# Patient Record
Sex: Female | Born: 1950 | Race: Black or African American | Hispanic: No | Marital: Married | Smoking: Former smoker
Health system: Southern US, Community
[De-identification: ages and names within clinical notes are randomized; demographics above are authoritative.]

## PROBLEM LIST (undated history)

## (undated) DIAGNOSIS — D241 Benign neoplasm of right breast: Secondary | ICD-10-CM

## (undated) DIAGNOSIS — T8859XA Other complications of anesthesia, initial encounter: Secondary | ICD-10-CM

## (undated) DIAGNOSIS — M8580 Other specified disorders of bone density and structure, unspecified site: Secondary | ICD-10-CM

## (undated) DIAGNOSIS — N879 Dysplasia of cervix uteri, unspecified: Secondary | ICD-10-CM

## (undated) DIAGNOSIS — T4145XA Adverse effect of unspecified anesthetic, initial encounter: Secondary | ICD-10-CM

## (undated) DIAGNOSIS — Z973 Presence of spectacles and contact lenses: Secondary | ICD-10-CM

## (undated) HISTORY — DX: Other specified disorders of bone density and structure, unspecified site: M85.80

## (undated) HISTORY — PX: COLONOSCOPY: SHX174

## (undated) HISTORY — PX: BREAST SURGERY: SHX581

## (undated) HISTORY — PX: PELVIC LAPAROSCOPY: SHX162

## (undated) HISTORY — PX: OTHER SURGICAL HISTORY: SHX169

## (undated) HISTORY — DX: Benign neoplasm of right breast: D24.1

## (undated) HISTORY — PX: GYNECOLOGIC CRYOSURGERY: SHX857

## (undated) HISTORY — DX: Dysplasia of cervix uteri, unspecified: N87.9

---

## 1978-02-19 HISTORY — PX: BREAST EXCISIONAL BIOPSY: SUR124

## 1990-02-19 HISTORY — PX: BREAST EXCISIONAL BIOPSY: SUR124

## 2002-09-22 ENCOUNTER — Other Ambulatory Visit: Admission: RE | Admit: 2002-09-22 | Discharge: 2002-09-22 | Payer: Self-pay | Admitting: Obstetrics and Gynecology

## 2002-10-21 ENCOUNTER — Encounter: Admission: RE | Admit: 2002-10-21 | Discharge: 2002-10-21 | Payer: Self-pay | Admitting: Obstetrics and Gynecology

## 2002-10-21 ENCOUNTER — Encounter: Payer: Self-pay | Admitting: Obstetrics and Gynecology

## 2003-09-28 ENCOUNTER — Other Ambulatory Visit: Admission: RE | Admit: 2003-09-28 | Discharge: 2003-09-28 | Payer: Self-pay | Admitting: Obstetrics and Gynecology

## 2004-09-29 ENCOUNTER — Other Ambulatory Visit: Admission: RE | Admit: 2004-09-29 | Discharge: 2004-09-29 | Payer: Self-pay | Admitting: Obstetrics and Gynecology

## 2004-10-04 ENCOUNTER — Encounter: Admission: RE | Admit: 2004-10-04 | Discharge: 2004-10-04 | Payer: Self-pay | Admitting: Obstetrics and Gynecology

## 2005-10-04 ENCOUNTER — Other Ambulatory Visit: Admission: RE | Admit: 2005-10-04 | Discharge: 2005-10-04 | Payer: Self-pay | Admitting: Obstetrics and Gynecology

## 2005-10-11 ENCOUNTER — Encounter: Admission: RE | Admit: 2005-10-11 | Discharge: 2005-10-11 | Payer: Self-pay | Admitting: Obstetrics and Gynecology

## 2006-11-07 ENCOUNTER — Encounter: Admission: RE | Admit: 2006-11-07 | Discharge: 2006-11-07 | Payer: Self-pay | Admitting: Obstetrics and Gynecology

## 2006-11-07 ENCOUNTER — Other Ambulatory Visit: Admission: RE | Admit: 2006-11-07 | Discharge: 2006-11-07 | Payer: Self-pay | Admitting: Obstetrics and Gynecology

## 2006-11-13 ENCOUNTER — Encounter: Admission: RE | Admit: 2006-11-13 | Discharge: 2006-11-13 | Payer: Self-pay | Admitting: Obstetrics and Gynecology

## 2007-10-17 ENCOUNTER — Ambulatory Visit (HOSPITAL_COMMUNITY): Admission: RE | Admit: 2007-10-17 | Discharge: 2007-10-17 | Payer: Self-pay | Admitting: Gastroenterology

## 2007-10-17 ENCOUNTER — Encounter (INDEPENDENT_AMBULATORY_CARE_PROVIDER_SITE_OTHER): Payer: Self-pay | Admitting: Gastroenterology

## 2007-11-13 ENCOUNTER — Ambulatory Visit: Payer: Self-pay | Admitting: Obstetrics and Gynecology

## 2007-11-13 ENCOUNTER — Encounter: Admission: RE | Admit: 2007-11-13 | Discharge: 2007-11-13 | Payer: Self-pay | Admitting: Obstetrics and Gynecology

## 2007-11-13 ENCOUNTER — Encounter: Payer: Self-pay | Admitting: Obstetrics and Gynecology

## 2007-11-13 ENCOUNTER — Other Ambulatory Visit: Admission: RE | Admit: 2007-11-13 | Discharge: 2007-11-13 | Payer: Self-pay | Admitting: Obstetrics and Gynecology

## 2007-11-26 ENCOUNTER — Encounter: Admission: RE | Admit: 2007-11-26 | Discharge: 2007-11-26 | Payer: Self-pay | Admitting: Obstetrics and Gynecology

## 2007-12-03 ENCOUNTER — Encounter: Admission: RE | Admit: 2007-12-03 | Discharge: 2007-12-03 | Payer: Self-pay | Admitting: Obstetrics and Gynecology

## 2007-12-03 HISTORY — PX: BREAST CYST ASPIRATION: SHX578

## 2008-06-14 ENCOUNTER — Emergency Department (HOSPITAL_COMMUNITY): Admission: EM | Admit: 2008-06-14 | Discharge: 2008-06-14 | Payer: Self-pay | Admitting: Emergency Medicine

## 2008-11-15 ENCOUNTER — Encounter: Admission: RE | Admit: 2008-11-15 | Discharge: 2008-11-15 | Payer: Self-pay | Admitting: Obstetrics and Gynecology

## 2008-11-24 ENCOUNTER — Encounter: Admission: RE | Admit: 2008-11-24 | Discharge: 2008-11-24 | Payer: Self-pay | Admitting: Obstetrics and Gynecology

## 2008-11-26 ENCOUNTER — Other Ambulatory Visit: Admission: RE | Admit: 2008-11-26 | Discharge: 2008-11-26 | Payer: Self-pay | Admitting: Obstetrics and Gynecology

## 2008-11-26 ENCOUNTER — Encounter: Payer: Self-pay | Admitting: Obstetrics and Gynecology

## 2008-11-26 ENCOUNTER — Ambulatory Visit: Payer: Self-pay | Admitting: Obstetrics and Gynecology

## 2009-07-07 IMAGING — US UNKNOWN US STUDY
1 series · 2 of 2 positions shown · non-contrast
Comparison: none

CLINICAL DATA: Cyst in the 1 o'clock position of the right breast
that has enlarged since prior study of 11/07/2006.  Aspiration is
suggested to confirm the benign impression of a cyst.

[Series 1: unknown us study · 2 of 2 slices shown]
[im 1/2]
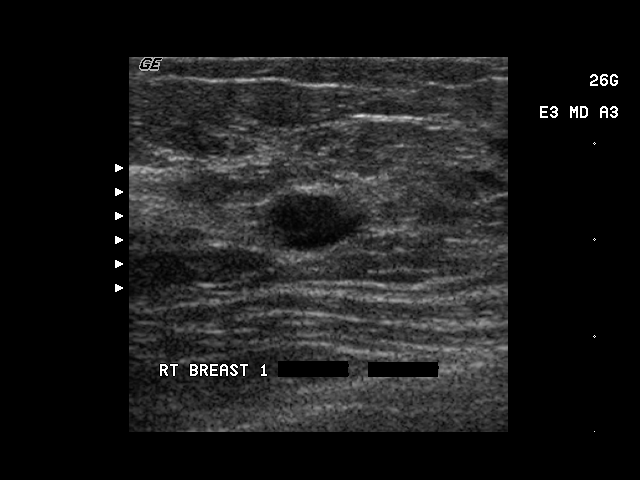
[im 2/2]
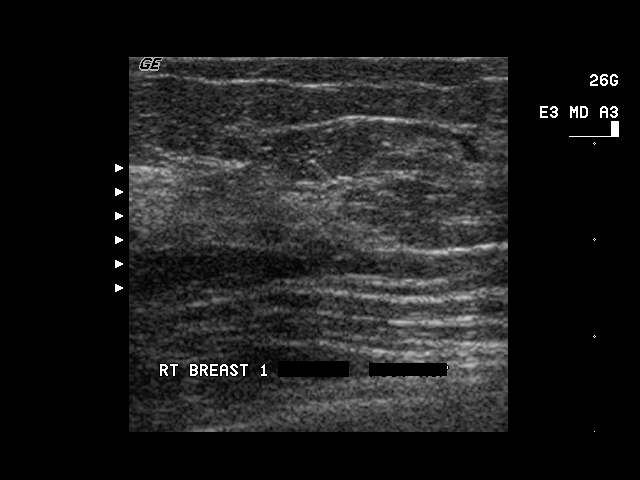

[2 of 2 positions shown; findings below may reference images not displayed]

ULTRASOUND-GUIDED ASPIRATION OF RIGHT BREAST CYST:

The procedure for ultrasound-guided aspiration was discussed with
the patient and verbal consent was granted.  Using sterile
technique, a 2% Xylocaine for local anesthesia and ultrasound
guidance, an 18 gauge needle was placed within the cyst at 1
o'clock in the right breast.  The cyst immediately decompressed.
Approximately 1.5 ml of cloudy yellow fluid was withdrawn.  There
was no residual mass.
IMPRESSION: Cyst at 1 o'clock in the right breast was aspirated with no
residual mass confirming its benign etiology.  Yearly screening
mammography is suggested.

BI-RADS CATEGORY 2:  Benign finding(s).

## 2010-01-13 ENCOUNTER — Encounter: Admission: RE | Admit: 2010-01-13 | Discharge: 2010-01-13 | Payer: Self-pay | Admitting: Obstetrics and Gynecology

## 2010-02-22 ENCOUNTER — Ambulatory Visit
Admission: RE | Admit: 2010-02-22 | Discharge: 2010-02-22 | Payer: Self-pay | Source: Home / Self Care | Attending: Obstetrics and Gynecology | Admitting: Obstetrics and Gynecology

## 2010-02-22 ENCOUNTER — Other Ambulatory Visit
Admission: RE | Admit: 2010-02-22 | Discharge: 2010-02-22 | Payer: Self-pay | Source: Home / Self Care | Admitting: Obstetrics and Gynecology

## 2010-04-19 ENCOUNTER — Encounter (INDEPENDENT_AMBULATORY_CARE_PROVIDER_SITE_OTHER): Payer: BC Managed Care – PPO

## 2010-04-19 DIAGNOSIS — M810 Age-related osteoporosis without current pathological fracture: Secondary | ICD-10-CM

## 2010-05-31 LAB — COMPREHENSIVE METABOLIC PANEL
ALT: 28 U/L (ref 0–35)
AST: 65 U/L — ABNORMAL HIGH (ref 0–37)
Albumin: 4.1 g/dL (ref 3.5–5.2)
Alkaline Phosphatase: 99 U/L (ref 39–117)
Potassium: 5.8 mEq/L — ABNORMAL HIGH (ref 3.5–5.1)
Sodium: 136 mEq/L (ref 135–145)
Total Protein: 7.7 g/dL (ref 6.0–8.3)

## 2010-05-31 LAB — CBC
Platelets: 294 10*3/uL (ref 150–400)
RDW: 15.1 % (ref 11.5–15.5)

## 2010-05-31 LAB — DIFFERENTIAL
Basophils Relative: 0 % (ref 0–1)
Eosinophils Relative: 1 % (ref 0–5)
Lymphs Abs: 2.3 10*3/uL (ref 0.7–4.0)
Monocytes Absolute: 0.7 10*3/uL (ref 0.1–1.0)

## 2010-06-14 ENCOUNTER — Institutional Professional Consult (permissible substitution) (INDEPENDENT_AMBULATORY_CARE_PROVIDER_SITE_OTHER): Payer: BC Managed Care – PPO | Admitting: Obstetrics and Gynecology

## 2010-06-14 DIAGNOSIS — M810 Age-related osteoporosis without current pathological fracture: Secondary | ICD-10-CM

## 2010-06-29 IMAGING — US US BREAST L
1 series · 14 of 23 positions shown · non-contrast
Comparison: Previous examinations, including the screening
mammogram dated 11/15/2008.

CLINICAL DATA: Possible left breast mass at recent screening
mammography.

DIGITAL DIAGNOSTIC  LEFT  MAMMOGRAM   AND LEFT BREAST ULTRASOUND:

[Series 1: us breast left · 14 of 23 slices shown]
[im 1/23]
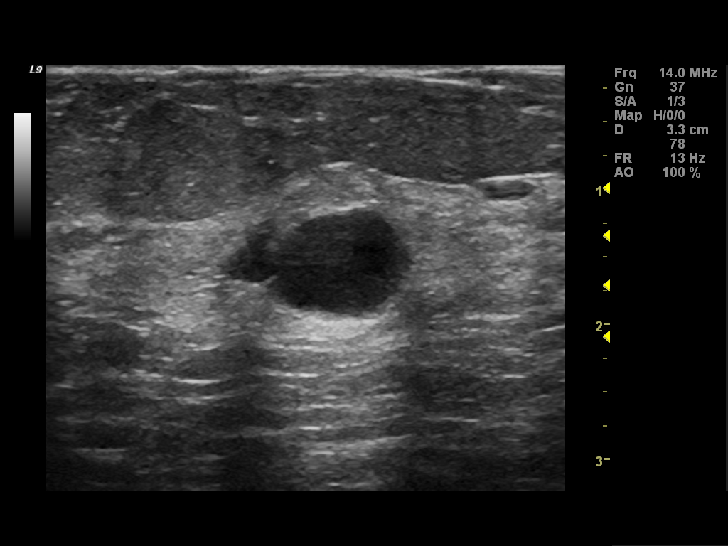
[im 3/23]
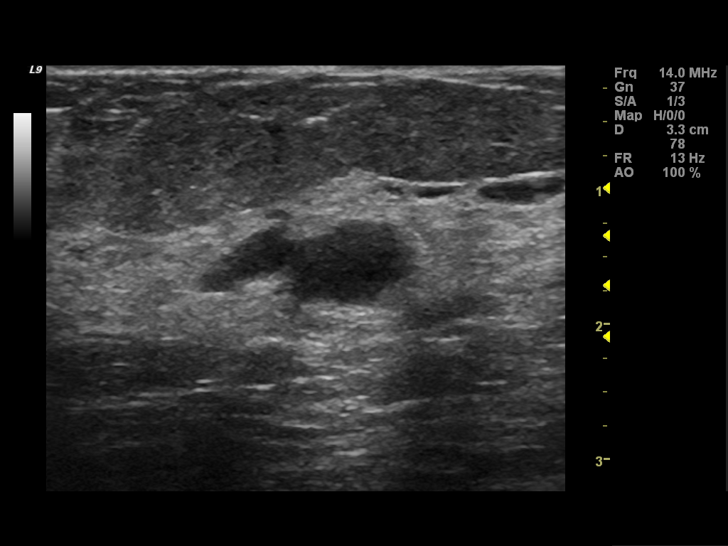
[im 5/23]
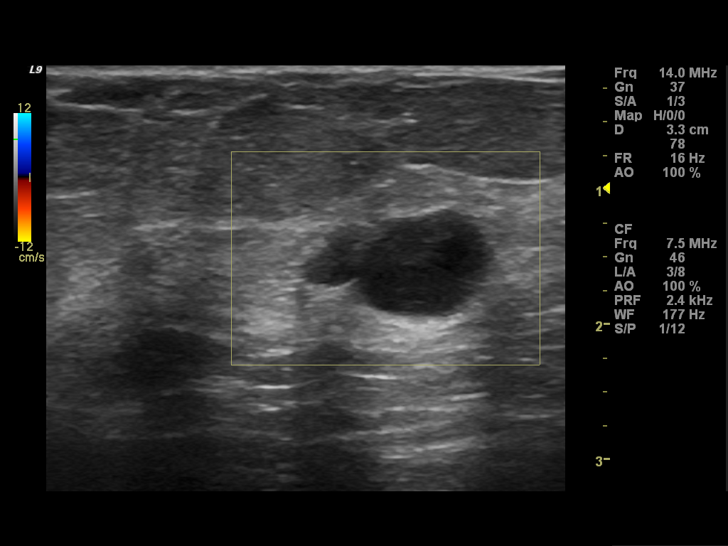
[im 6/23]
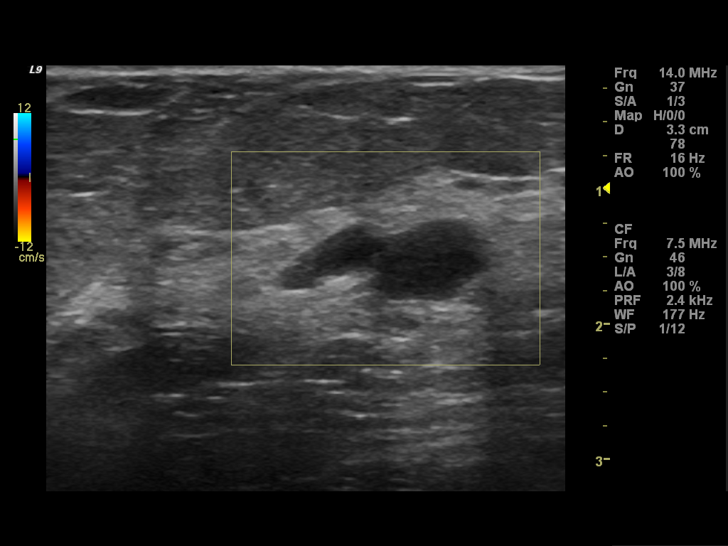
[im 8/23]
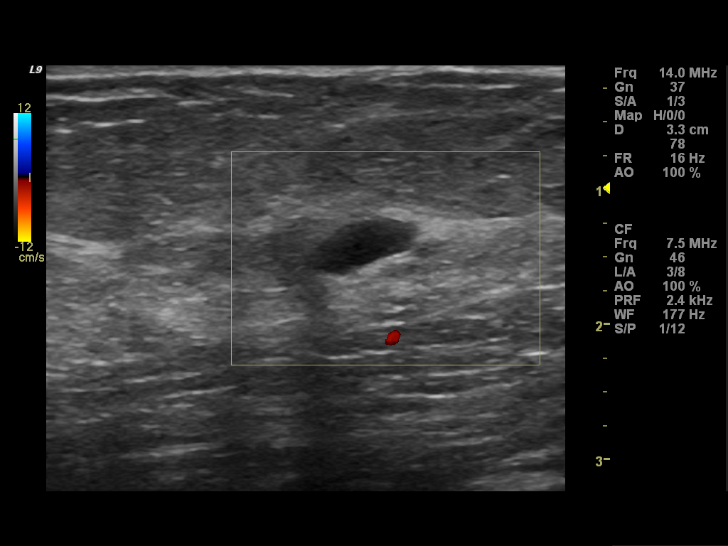
[im 10/23]
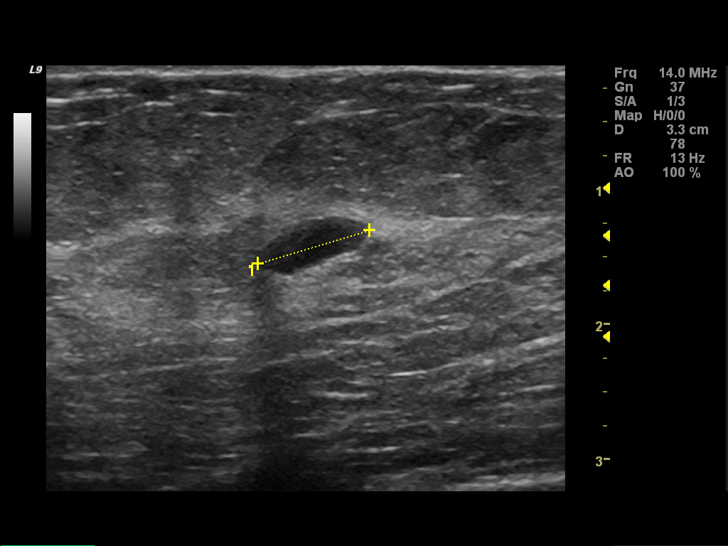
[im 11/23]
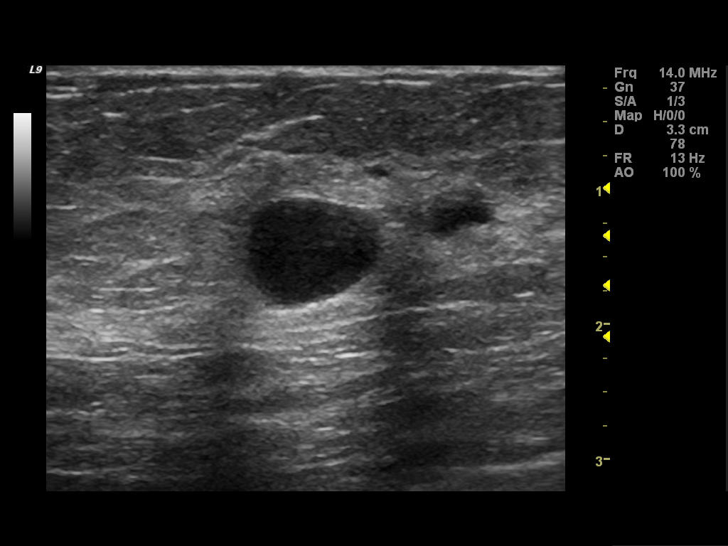
[im 13/23]
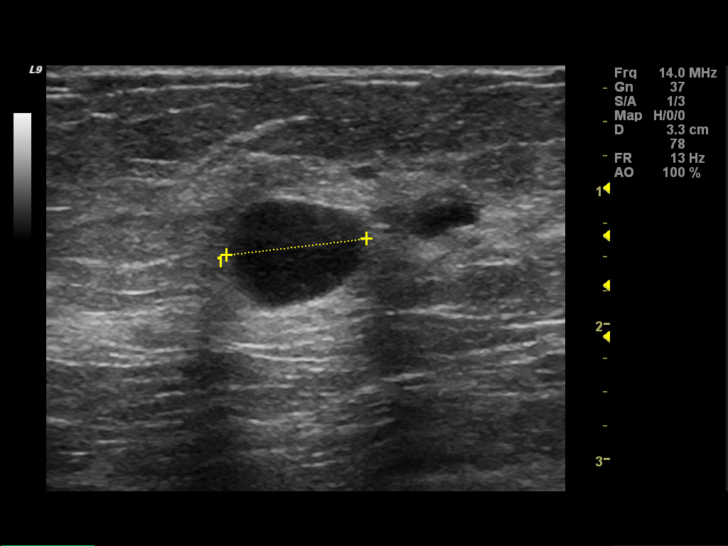
[im 14/23]
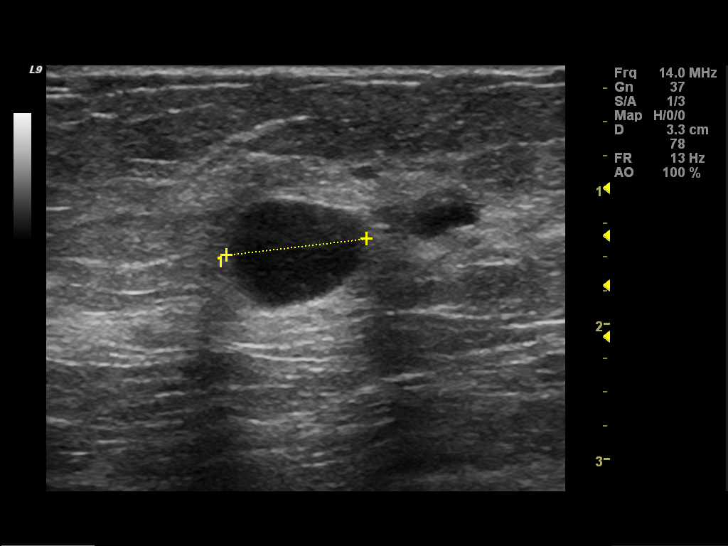
[im 16/23]
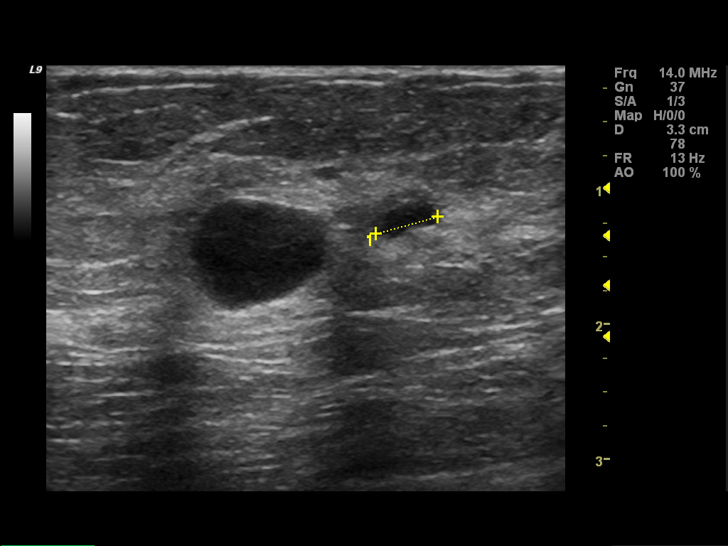
[im 18/23]
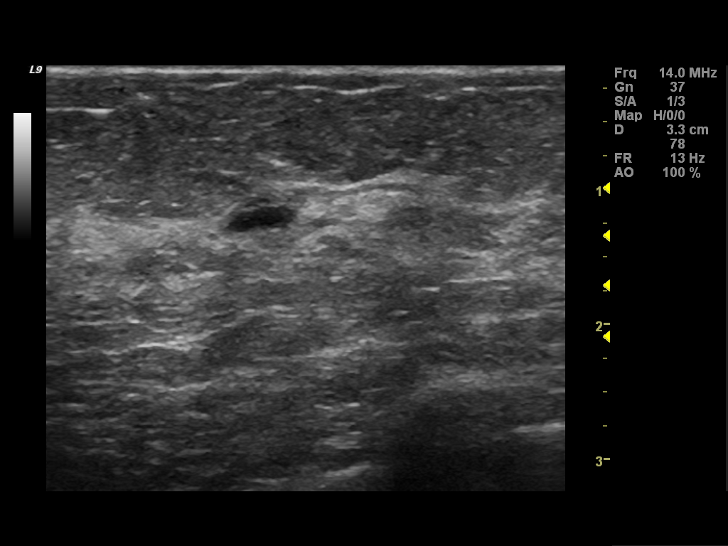
[im 19/23]
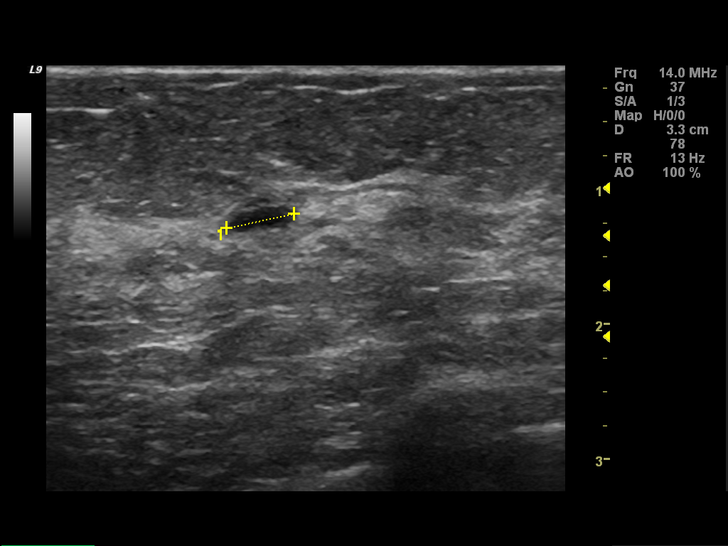
[im 21/23]
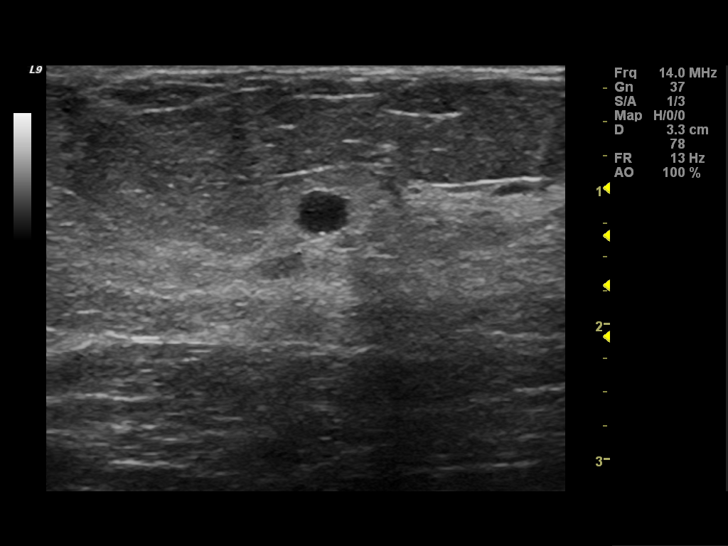
[im 23/23]
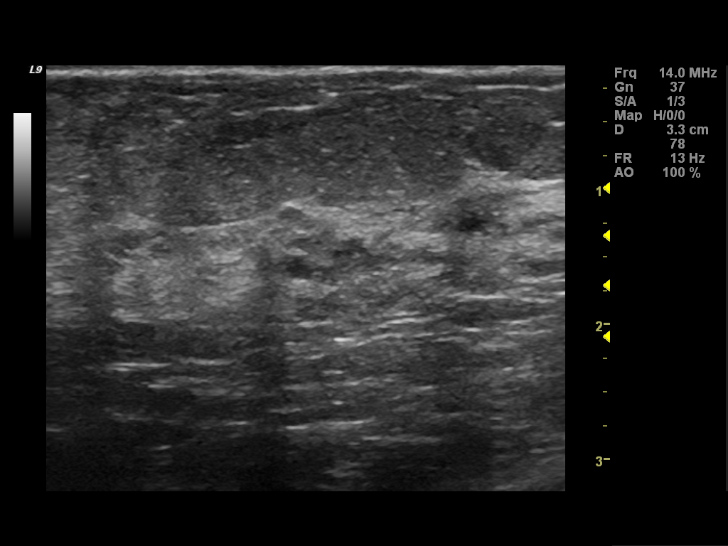

[14 of 23 positions shown; findings below may reference images not displayed]

FINDINGS: Spot compression views of the left breast confirm a
rounded, smoothly marginated mass in the outer left breast in
approximately the 3 o'clock position.

On physical exam, no mass is palpable in the outer left breast.

Ultrasound is performed, showing multiple cysts in the 3 o'clock
position of the left breast, 3 cm from the nipple.  The largest
measures 1.0 cm in maximum diameter and corresponds to the
mammographic mass.  No solid masses or other findings suspicious
for malignancy were seen.
IMPRESSION: Left breast cysts.  No evidence of malignancy.  Screening
mammography is recommended in 1 year.

BI-RADS CATEGORY 2:  Benign finding(s).

## 2010-07-04 NOTE — Op Note (Signed)
NAMELIRIO, BACH                 ACCOUNT NO.:  [id number]   MEDICAL RECORD NO.:  [id number]          PATIENT TYPE:  AMB   LOCATION:  ENDO                         FACILITY:  [facility]   PHYSICIAN:  Petra Kuba, M.D.    DATE OF BIRTH:  1950/12/02   DATE OF PROCEDURE:  10/17/2007  DATE OF DISCHARGE:                               OPERATIVE REPORT   PROCEDURE:  Colonoscopy with polypectomy.   INDICATIONS:  Patient with a previous colonoscopy who had an abnormal  fold.  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  Consent was signed after risks, benefits, methods, options  thoroughly discussed with the patient and her husband.   MEDICINE USED:  Fentanyl 100 mcg, Versed 10 mg.   PROCEDURE:  Rectal inspection is pertinent for external hemorrhoids,  small.  Digital exam was negative.  The video colonoscope was inserted,  easily advanced around the colon to the cecum.  This did not require any  abdominal pressure or any position changes.  No abnormalities were seen  on insertion.  The cecum was identified by the appendiceal orifice and  ileocecal valve.  The scope was slowly withdrawn.  No abnormalities were  seen as we slowly withdrew around the colon until we got to the proximal  descending at approximately 45 cm where we had seen the polyp before and  the abnormal fold was confirmed.  Photo documentation was obtained.  The  prep was adequate, not mentioned above.  There was very minimal liquid  stool that required washing and suctioning.  We went ahead and first  snared the polyp, electrocautery was applied and the polyp was removed,  suctioned through the scope and collected in the trap.  We then went  ahead and did hot biopsies around the edge just to ensure we got as much  of the polypoid tissue if not all of it as we could since it was a very  sessile lesion.  We then went ahead and  injected [place] ink in the  customary fashion around the polyp.  On one we did make a bleb, on the  other we may have gotten just some minimal superficial injections.  We  went ahead and slowly withdrew.  No additional lesions were seen as we  slowly withdrew back to the rectum.  Anorectal pull-through and  retroflexion confirmed some small hemorrhoids.  The scope was  straightened and readvanced toward upper left side of the colon.  Air  was suctioned, scope removed.  The patient tolerated the procedure well.  There was no obvious immediate complication.   ENDOSCOPIC DIAGNOSES:  1. Internal and external hemorrhoids.  2. Descending small polyp found, snared, multiple hot biopsies of the      edge and injected with [place] ink in the customary fashion.  3. Otherwise within normal limits to the cecum.   PLAN:  Await pathology to determine the future colonic screening,  although with her high-grade dysplasia before, we will probably have to  proceed sooner than later,  otherwise customary post polypectomy orders.  Happy to see her back p.r.n.           ______________________________  Petra Kuba, M.D.     MEM/MEDQ  D:  10/17/2007  T:  10/17/2007  Job:  [id number]   cc:   Reuel Boom L. Eda Paschal, M.D.  Fax: [phone]

## 2011-01-05 ENCOUNTER — Other Ambulatory Visit: Payer: Self-pay | Admitting: Obstetrics and Gynecology

## 2011-01-05 DIAGNOSIS — Z1231 Encounter for screening mammogram for malignant neoplasm of breast: Secondary | ICD-10-CM

## 2011-01-15 ENCOUNTER — Ambulatory Visit
Admission: RE | Admit: 2011-01-15 | Discharge: 2011-01-15 | Disposition: A | Discharge status: Home / Self Care | Payer: BC Managed Care – PPO | Source: Ambulatory Visit | Attending: Obstetrics and Gynecology | Admitting: Obstetrics and Gynecology

## 2011-01-15 DIAGNOSIS — Z1231 Encounter for screening mammogram for malignant neoplasm of breast: Secondary | ICD-10-CM

## 2011-12-12 ENCOUNTER — Other Ambulatory Visit: Payer: Self-pay | Admitting: Internal Medicine

## 2011-12-12 DIAGNOSIS — Z1231 Encounter for screening mammogram for malignant neoplasm of breast: Secondary | ICD-10-CM

## 2012-01-25 ENCOUNTER — Ambulatory Visit
Admission: RE | Admit: 2012-01-25 | Discharge: 2012-01-25 | Disposition: A | Discharge status: Home / Self Care | Payer: BC Managed Care – PPO | Source: Ambulatory Visit | Attending: Internal Medicine | Admitting: Internal Medicine

## 2012-01-25 DIAGNOSIS — Z1231 Encounter for screening mammogram for malignant neoplasm of breast: Secondary | ICD-10-CM

## 2012-01-31 ENCOUNTER — Other Ambulatory Visit: Payer: Self-pay | Admitting: Internal Medicine

## 2012-01-31 DIAGNOSIS — R928 Other abnormal and inconclusive findings on diagnostic imaging of breast: Secondary | ICD-10-CM

## 2012-02-04 ENCOUNTER — Ambulatory Visit
Admission: RE | Admit: 2012-02-04 | Discharge: 2012-02-04 | Disposition: A | Discharge status: Home / Self Care | Payer: BC Managed Care – PPO | Source: Ambulatory Visit | Attending: Internal Medicine | Admitting: Internal Medicine

## 2012-02-04 DIAGNOSIS — R928 Other abnormal and inconclusive findings on diagnostic imaging of breast: Secondary | ICD-10-CM

## 2012-07-17 ENCOUNTER — Encounter: Payer: Self-pay | Admitting: Gynecology

## 2012-07-17 ENCOUNTER — Ambulatory Visit (INDEPENDENT_AMBULATORY_CARE_PROVIDER_SITE_OTHER): Payer: BC Managed Care – PPO | Admitting: Gynecology

## 2012-07-17 VITALS — BP 130/80 | Ht 60.0 in | Wt 200.0 lb

## 2012-07-17 DIAGNOSIS — Z01419 Encounter for gynecological examination (general) (routine) without abnormal findings: Secondary | ICD-10-CM

## 2012-07-17 DIAGNOSIS — M810 Age-related osteoporosis without current pathological fracture: Secondary | ICD-10-CM

## 2012-07-17 DIAGNOSIS — Z13220 Encounter for screening for lipoid disorders: Secondary | ICD-10-CM

## 2012-07-17 LAB — CBC WITH DIFFERENTIAL/PLATELET
Basophils Absolute: 0 10*3/uL (ref 0.0–0.1)
Basophils Relative: 1 % (ref 0–1)
Eosinophils Relative: 1 % (ref 0–5)
HCT: 40 % (ref 36.0–46.0)
MCHC: 32.8 g/dL (ref 30.0–36.0)
MCV: 86 fL (ref 78.0–100.0)
Monocytes Absolute: 0.5 10*3/uL (ref 0.1–1.0)
Neutro Abs: 3.2 10*3/uL (ref 1.7–7.7)
RDW: 15.2 % (ref 11.5–15.5)

## 2012-07-17 NOTE — Progress Notes (Signed)
TYRENA GOHR 1951/02/14 [id number]        62 y.o.  [id number] for annual exam.  Several issues noted below.  Past medical history,surgical history, medications, allergies, family history and social history were all reviewed and documented in the EPIC chart. ROS:  Was performed and pertinent positives and negatives are included in the history.  Exam: [name] assistant Filed Vitals:   07/17/12 1612  BP: 130/80  Height: 5' (1.524 m)  Weight: 200 lb (90.719 kg)   General appearance  Normal Skin grossly normal Head/Neck normal with no cervical or supraclavicular adenopathy thyroid normal Lungs  clear Cardiac RR, without RMG Abdominal  soft, nontender, without masses, organomegaly or hernia Breasts  examined lying and sitting without masses, retractions, discharge or axillary adenopathy. Pelvic  Ext/BUS/vagina  normal with atrophic changes  Cervix  normal with atrophic changes  Uterus  grossly normal size, midline and mobile nontender   Adnexa  Without masses or tenderness    Anus and perineum  normal   Rectovaginal  normal sphincter tone without palpated masses or tenderness.    Assessment/Plan:  62 y.o. [id number] female for annual exam.   1. Postmenopausal. Doing well without significant symptoms of hot flushes sweats vaginal dryness or dyspareunia. Plan expectant management. 2. Osteoporosis. DEXA 2012 T score -2.6 AP spine. Had tried Fosamax for one or 2 months and then Benita and did not tolerate it due to GI side effects. Was offered IV Reclast but ultimately declined due to fear from side effects. Recheck DEXA now and we'll discuss treatment options after comparing to her prior study. Check vitamin D level today. Increase calcium vitamin D reviewed. 3. Mammography 01/2012. Continue with annual mammography. 4. Pap smear 2012. No Pap smear done today. History of cryosurgery age 41 with normal Pap smears after. Plan repeat Pap smear next year a 3 year interval. 5. Colonoscopy 2009. Plan to  repeat this coming year. 6. Health maintenance. Patient asked if I would do her blood work and she's going to forward these to her primary physician. I ordered a CBC comprehensive metabolic panel lipid profile TSH urinalysis and vitamin D level. Followup for DEXA, otherwise 1 year    Dara Lords MD, 4:46 PM 07/17/2012

## 2012-07-17 NOTE — Patient Instructions (Signed)
Followup for bone density as scheduled. Otherwise followup in one year for annual exam. 

## 2012-07-18 LAB — COMPREHENSIVE METABOLIC PANEL
AST: 19 U/L (ref 0–37)
Alkaline Phosphatase: 124 U/L — ABNORMAL HIGH (ref 39–117)
BUN: 14 mg/dL (ref 6–23)
Creat: 0.58 mg/dL (ref 0.50–1.10)

## 2012-07-18 LAB — LIPID PANEL
HDL: 53 mg/dL (ref >39)
LDL Cholesterol: 116 mg/dL — ABNORMAL HIGH (ref 0–99)
Total CHOL/HDL Ratio: 3.5 Ratio

## 2012-07-18 LAB — URINALYSIS W MICROSCOPIC + REFLEX CULTURE
Bacteria, UA: NONE SEEN
Casts: NONE SEEN
Hgb urine dipstick: NEGATIVE
Ketones, ur: NEGATIVE mg/dL
Nitrite: NEGATIVE
Urobilinogen, UA: 0.2 mg/dL (ref 0.0–1.0)

## 2012-07-18 LAB — HEMOGLOBIN A1C
Hgb A1c MFr Bld: 5.2 % (ref <5.7)
Mean Plasma Glucose: 103 mg/dL (ref <117)

## 2012-07-18 LAB — VITAMIN D 25 HYDROXY (VIT D DEFICIENCY, FRACTURES): Vit D, 25-Hydroxy: 33 ng/mL (ref 30–89)

## 2012-07-18 LAB — TSH: TSH: 1.629 u[IU]/mL (ref 0.350–4.500)

## 2012-07-20 DIAGNOSIS — M8580 Other specified disorders of bone density and structure, unspecified site: Secondary | ICD-10-CM

## 2012-07-20 HISTORY — DX: Other specified disorders of bone density and structure, unspecified site: M85.80

## 2012-07-23 ENCOUNTER — Encounter: Payer: Self-pay | Admitting: Obstetrics and Gynecology

## 2012-08-12 ENCOUNTER — Ambulatory Visit (INDEPENDENT_AMBULATORY_CARE_PROVIDER_SITE_OTHER): Payer: BC Managed Care – PPO

## 2012-08-12 DIAGNOSIS — M810 Age-related osteoporosis without current pathological fracture: Secondary | ICD-10-CM

## 2012-08-12 DIAGNOSIS — M8580 Other specified disorders of bone density and structure, unspecified site: Secondary | ICD-10-CM

## 2012-08-12 DIAGNOSIS — M899 Disorder of bone, unspecified: Secondary | ICD-10-CM

## 2012-08-12 DIAGNOSIS — M949 Disorder of cartilage, unspecified: Secondary | ICD-10-CM

## 2012-08-14 ENCOUNTER — Telehealth: Payer: Self-pay | Admitting: Gynecology

## 2012-08-14 ENCOUNTER — Encounter: Payer: Self-pay | Admitting: Gynecology

## 2012-08-14 NOTE — Telephone Encounter (Signed)
Tell patient that her bone density looks better and I do not recommend any treatment at this time. Will repeat her bone density in several years and follow at this point.

## 2012-08-14 NOTE — Telephone Encounter (Signed)
Left message for pt to call.

## 2012-08-21 NOTE — Telephone Encounter (Signed)
LEFT MESSAGE FOR PT TO CALL.

## 2012-09-10 NOTE — Telephone Encounter (Signed)
Pt informed with the below note. 

## 2012-12-25 ENCOUNTER — Other Ambulatory Visit: Payer: Self-pay

## 2013-01-13 ENCOUNTER — Other Ambulatory Visit: Payer: Self-pay

## 2013-01-13 DIAGNOSIS — Z1231 Encounter for screening mammogram for malignant neoplasm of breast: Secondary | ICD-10-CM

## 2013-02-19 DIAGNOSIS — D241 Benign neoplasm of right breast: Secondary | ICD-10-CM

## 2013-02-19 HISTORY — DX: Benign neoplasm of right breast: D24.1

## 2013-02-23 ENCOUNTER — Ambulatory Visit: Payer: BC Managed Care – PPO

## 2013-03-17 ENCOUNTER — Ambulatory Visit
Admission: RE | Admit: 2013-03-17 | Discharge: 2013-03-17 | Disposition: A | Discharge status: Home / Self Care | Payer: BC Managed Care – PPO | Source: Ambulatory Visit

## 2013-03-17 DIAGNOSIS — Z1231 Encounter for screening mammogram for malignant neoplasm of breast: Secondary | ICD-10-CM

## 2013-03-20 ENCOUNTER — Other Ambulatory Visit: Payer: Self-pay | Admitting: Internal Medicine

## 2013-03-20 DIAGNOSIS — R928 Other abnormal and inconclusive findings on diagnostic imaging of breast: Secondary | ICD-10-CM

## 2013-03-25 ENCOUNTER — Other Ambulatory Visit: Payer: Self-pay | Admitting: Internal Medicine

## 2013-03-25 ENCOUNTER — Ambulatory Visit
Admission: RE | Admit: 2013-03-25 | Discharge: 2013-03-25 | Disposition: A | Discharge status: Home / Self Care | Payer: BC Managed Care – PPO | Source: Ambulatory Visit | Attending: Internal Medicine | Admitting: Internal Medicine

## 2013-03-25 DIAGNOSIS — R928 Other abnormal and inconclusive findings on diagnostic imaging of breast: Secondary | ICD-10-CM

## 2013-05-08 ENCOUNTER — Ambulatory Visit
Admission: RE | Admit: 2013-05-08 | Discharge: 2013-05-08 | Disposition: A | Discharge status: Home / Self Care | Payer: BC Managed Care – PPO | Source: Ambulatory Visit | Attending: Internal Medicine | Admitting: Internal Medicine

## 2013-05-08 DIAGNOSIS — R928 Other abnormal and inconclusive findings on diagnostic imaging of breast: Secondary | ICD-10-CM

## 2013-05-08 HISTORY — PX: BREAST BIOPSY: SHX20

## 2013-05-21 ENCOUNTER — Ambulatory Visit (INDEPENDENT_AMBULATORY_CARE_PROVIDER_SITE_OTHER): Payer: BC Managed Care – PPO | Admitting: General Surgery

## 2013-05-21 ENCOUNTER — Encounter (INDEPENDENT_AMBULATORY_CARE_PROVIDER_SITE_OTHER): Payer: Self-pay | Admitting: General Surgery

## 2013-05-21 VITALS — BP 128/80 | HR 76 | Temp 97.8°F | Resp 16 | Ht 59.0 in | Wt 189.6 lb

## 2013-05-21 DIAGNOSIS — D249 Benign neoplasm of unspecified breast: Secondary | ICD-10-CM

## 2013-05-21 NOTE — Progress Notes (Signed)
Subjective:   abnormal mammogram and core biopsy showing papilloma  Patient ID: Megan Alvarez, female   DOB: 11/24/1950, 63 y.o.   MRN: [id number]  HPI Patient is a very pleasant 63 year old female referred by Dr. Donavan Burnet following a recent large core needle biopsy of the right breast. The patient recently presented for routine screening mammogram. This revealed potentially a new area of microcalcifications at the 12:00 position of the right breast. Subsequent diagnostic mammogram was performed showing an 8 x 7 x 3 mm group of amorphous calcifications some of which were linear at the 12:00 position in the right breast anterior depth. There were some additional groups of calcifications posterior to this that appeared stable. Large core needle biopsy was recommended and was performed. 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 Calcifications were associated with fibrocystic change only. The patient has a history of fibrocystic breast disease and has had 2 previous open biopsies a number of years ago. She denies any current breast symptoms such as nipple discharge, lobe or pain. She has no family history of breast cancer.  Past Medical History  Diagnosis Date  . Cervical dysplasia   . Osteopenia 07/2012    T score -1.8 FRAX 3%/0.2%   Past Surgical History  Procedure Laterality Date  . Pelvic laparoscopy    . Breast surgery      Biopsy-benign  . Breast cysts removed    . Gynecologic cryosurgery  age 43   Current Outpatient Prescriptions  Medication Sig Dispense Refill  . Calcium Carbonate-Vitamin D (CALCIUM + D PO) Take by mouth.      . Multiple Vitamin (MULTIVITAMIN) tablet Take 1 tablet by mouth daily.       No current facility-administered medications for this visit.   No Known Allergies   Review of Systems  Constitutional: Negative.   Respiratory: Negative.   Cardiovascular: Negative.        Objective:   Physical Exam General:  Alert, well-developed African American female, in no distress Skin: Warm and dry without rash or infection. HEENT: No palpable masses or thyromegaly. Sclera nonicteric. Pupils equal round and reactive. Oropharynx clear. Lymph nodes: No cervical, supraclavicular, or inguinal nodes palpable. Breasts: There are healed incisions bilaterally including at the 12:00 position of the right breast. There is a small approximately 1 cm tender mass superficially at the 12:00 position of the right breast about 2 cm away from the nipple, likely representing a small post biopsy hematoma Lungs: Breath sounds clear and equal without increased work of breathing Cardiovascular: Regular rate and rhythm without murmur. No JVD or edema. Peripheral pulses intact. Abdomen: Nondistended. Soft and nontender. No masses palpable. No organomegaly. No palpable hernias. Extremities: No edema or joint swelling or deformity. No chronic venous stasis changes. Neurologic: Alert and fully oriented. Gait normal.    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 I've recommended complete excision of the lesion to rule out the small chance of an underlying early malignancy. I discussed the nature of the procedure and its indications as well as risks of anesthetic complications, bleeding and infection. All the patient's and her husband questions were answered and she desires to proceed.     Plan:     Needle localized right breast lumpectomy as an outpatient under general anesthesia

## 2013-05-24 ENCOUNTER — Other Ambulatory Visit (INDEPENDENT_AMBULATORY_CARE_PROVIDER_SITE_OTHER): Payer: Self-pay | Admitting: General Surgery

## 2013-05-24 DIAGNOSIS — D249 Benign neoplasm of unspecified breast: Secondary | ICD-10-CM

## 2013-05-25 ENCOUNTER — Other Ambulatory Visit (INDEPENDENT_AMBULATORY_CARE_PROVIDER_SITE_OTHER): Payer: Self-pay | Admitting: General Surgery

## 2013-05-25 ENCOUNTER — Inpatient Hospital Stay: Admission: RE | Admit: 2013-05-25 | Payer: BC Managed Care – PPO | Source: Ambulatory Visit

## 2013-05-25 DIAGNOSIS — D249 Benign neoplasm of unspecified breast: Secondary | ICD-10-CM

## 2013-06-11 ENCOUNTER — Ambulatory Visit (INDEPENDENT_AMBULATORY_CARE_PROVIDER_SITE_OTHER): Payer: BC Managed Care – PPO | Admitting: General Surgery

## 2013-06-15 ENCOUNTER — Encounter (HOSPITAL_BASED_OUTPATIENT_CLINIC_OR_DEPARTMENT_OTHER): Payer: Self-pay | Admitting: *Deleted

## 2013-06-15 NOTE — Progress Notes (Signed)
No labs needed-denies any cardiac or resp problems  

## 2013-06-17 ENCOUNTER — Other Ambulatory Visit (INDEPENDENT_AMBULATORY_CARE_PROVIDER_SITE_OTHER): Payer: Self-pay | Admitting: General Surgery

## 2013-06-17 DIAGNOSIS — D249 Benign neoplasm of unspecified breast: Secondary | ICD-10-CM

## 2013-06-18 ENCOUNTER — Ambulatory Visit
Admission: RE | Admit: 2013-06-18 | Discharge: 2013-06-18 | Disposition: A | Discharge status: Home / Self Care | Payer: BC Managed Care – PPO | Source: Ambulatory Visit | Attending: General Surgery | Admitting: General Surgery

## 2013-06-18 DIAGNOSIS — D249 Benign neoplasm of unspecified breast: Secondary | ICD-10-CM

## 2013-06-18 HISTORY — PX: BREAST BIOPSY: SHX20

## 2013-06-22 ENCOUNTER — Ambulatory Visit
Admission: RE | Admit: 2013-06-22 | Discharge: 2013-06-22 | Disposition: A | Discharge status: Home / Self Care | Payer: BC Managed Care – PPO | Source: Ambulatory Visit | Attending: General Surgery | Admitting: General Surgery

## 2013-06-22 ENCOUNTER — Ambulatory Visit (HOSPITAL_BASED_OUTPATIENT_CLINIC_OR_DEPARTMENT_OTHER)
Admission: RE | Admit: 2013-06-22 | Discharge: 2013-06-22 | Disposition: A | Discharge status: Home / Self Care | Payer: BC Managed Care – PPO | Source: Ambulatory Visit | Attending: General Surgery | Admitting: General Surgery

## 2013-06-22 ENCOUNTER — Encounter (HOSPITAL_BASED_OUTPATIENT_CLINIC_OR_DEPARTMENT_OTHER): Payer: Self-pay | Admitting: Anesthesiology

## 2013-06-22 ENCOUNTER — Encounter (HOSPITAL_BASED_OUTPATIENT_CLINIC_OR_DEPARTMENT_OTHER)
Admission: RE | Disposition: A | Discharge status: Home / Self Care | Payer: Self-pay | Source: Ambulatory Visit | Attending: General Surgery

## 2013-06-22 ENCOUNTER — Ambulatory Visit (HOSPITAL_BASED_OUTPATIENT_CLINIC_OR_DEPARTMENT_OTHER): Payer: BC Managed Care – PPO | Admitting: Anesthesiology

## 2013-06-22 ENCOUNTER — Encounter (HOSPITAL_BASED_OUTPATIENT_CLINIC_OR_DEPARTMENT_OTHER): Payer: BC Managed Care – PPO | Admitting: Anesthesiology

## 2013-06-22 DIAGNOSIS — R920 Mammographic microcalcification found on diagnostic imaging of breast: Secondary | ICD-10-CM

## 2013-06-22 DIAGNOSIS — D249 Benign neoplasm of unspecified breast: Secondary | ICD-10-CM

## 2013-06-22 HISTORY — DX: Presence of spectacles and contact lenses: Z97.3

## 2013-06-22 HISTORY — DX: Other complications of anesthesia, initial encounter: T88.59XA

## 2013-06-22 HISTORY — PX: BREAST EXCISIONAL BIOPSY: SUR124

## 2013-06-22 HISTORY — DX: Adverse effect of unspecified anesthetic, initial encounter: T41.45XA

## 2013-06-22 HISTORY — PX: BREAST LUMPECTOMY WITH NEEDLE LOCALIZATION: SHX5759

## 2013-06-22 LAB — POCT HEMOGLOBIN-HEMACUE: HEMOGLOBIN: 14.5 g/dL (ref 12.0–15.0)

## 2013-06-22 SURGERY — BREAST LUMPECTOMY WITH NEEDLE LOCALIZATION
Anesthesia: General | Site: Breast | Laterality: Right

## 2013-06-22 MED ORDER — LACTATED RINGERS IV SOLN
INTRAVENOUS | Status: DC
Start: 2013-06-22 — End: 2013-06-22
  Administered 2013-06-22: 09:00:00 via INTRAVENOUS

## 2013-06-22 MED ORDER — HYDROCODONE-ACETAMINOPHEN 5-325 MG PO TABS: 1.0000 | ORAL_TABLET | ORAL | Status: DC | PRN

## 2013-06-22 MED ORDER — MIDAZOLAM HCL 2 MG/2ML IJ SOLN: 1.0000 mg | INTRAMUSCULAR | Status: DC | PRN

## 2013-06-22 MED ORDER — CHLORHEXIDINE GLUCONATE 4 % EX LIQD: 1.0000 "application " | Freq: Once | CUTANEOUS | Status: DC

## 2013-06-22 MED ORDER — BUPIVACAINE-EPINEPHRINE 0.5% -1:200000 IJ SOLN
INTRAMUSCULAR | Status: DC | PRN
  Administered 2013-06-22: 20 mL

## 2013-06-22 MED ORDER — CEFAZOLIN SODIUM-DEXTROSE 2-3 GM-% IV SOLR: 2.0000 g | INTRAVENOUS | Status: DC

## 2013-06-22 MED ORDER — LIDOCAINE HCL (CARDIAC) 20 MG/ML IV SOLN
INTRAVENOUS | Status: DC | PRN
  Administered 2013-06-22: 40 mg via INTRAVENOUS

## 2013-06-22 MED ORDER — BUPIVACAINE-EPINEPHRINE (PF) 0.5% -1:200000 IJ SOLN
INTRAMUSCULAR | Status: AC
  Filled 2013-06-22: qty 30

## 2013-06-22 MED ORDER — DEXAMETHASONE SODIUM PHOSPHATE 4 MG/ML IJ SOLN
INTRAMUSCULAR | Status: DC | PRN
  Administered 2013-06-22: 10 mg via INTRAVENOUS

## 2013-06-22 MED ORDER — PROPOFOL 10 MG/ML IV BOLUS
INTRAVENOUS | Status: DC | PRN
  Administered 2013-06-22: 200 mg via INTRAVENOUS

## 2013-06-22 MED ORDER — MIDAZOLAM HCL 5 MG/5ML IJ SOLN
INTRAMUSCULAR | Status: DC | PRN
  Administered 2013-06-22: 2 mg via INTRAVENOUS

## 2013-06-22 MED ORDER — ONDANSETRON HCL 4 MG/2ML IJ SOLN
INTRAMUSCULAR | Status: DC | PRN
  Administered 2013-06-22: 4 mg via INTRAVENOUS

## 2013-06-22 MED ORDER — MIDAZOLAM HCL 2 MG/2ML IJ SOLN
INTRAMUSCULAR | Status: AC
  Filled 2013-06-22: qty 2

## 2013-06-22 MED ORDER — FENTANYL CITRATE 0.05 MG/ML IJ SOLN
INTRAMUSCULAR | Status: DC | PRN
  Administered 2013-06-22 (×2): 50 ug via INTRAVENOUS

## 2013-06-22 MED ORDER — CEFAZOLIN SODIUM-DEXTROSE 2-3 GM-% IV SOLR
INTRAVENOUS | Status: AC
  Filled 2013-06-22: qty 50

## 2013-06-22 MED ORDER — FENTANYL CITRATE 0.05 MG/ML IJ SOLN
INTRAMUSCULAR | Status: AC
  Filled 2013-06-22: qty 6

## 2013-06-22 MED ORDER — FENTANYL CITRATE 0.05 MG/ML IJ SOLN: 50.0000 ug | INTRAMUSCULAR | Status: DC | PRN

## 2013-06-22 SURGICAL SUPPLY — 44 items
BLADE 10 SAFETY STRL DISP (BLADE) IMPLANT
BLADE 15 SAFETY STRL DISP (BLADE) ×2 IMPLANT
CANISTER SUCT 1200ML W/VALVE (MISCELLANEOUS) IMPLANT
CHLORAPREP W/TINT 26ML (MISCELLANEOUS) ×2 IMPLANT
CLIP TI MEDIUM 6 (CLIP) IMPLANT
CLIP TI WIDE RED SMALL 6 (CLIP) ×2 IMPLANT
COVER MAYO STAND STRL (DRAPES) ×2 IMPLANT
COVER TABLE BACK 60X90 (DRAPES) ×2 IMPLANT
DERMABOND ADVANCED (GAUZE/BANDAGES/DRESSINGS) ×1
DERMABOND ADVANCED .7 DNX12 (GAUZE/BANDAGES/DRESSINGS) ×1 IMPLANT
DEVICE DUBIN W/COMP PLATE 8390 (MISCELLANEOUS) ×2 IMPLANT
DRAPE PED LAPAROTOMY (DRAPES) ×2 IMPLANT
DRAPE UTILITY XL STRL (DRAPES) ×2 IMPLANT
ELECT COATED BLADE 2.86 ST (ELECTRODE) ×2 IMPLANT
ELECT REM PT RETURN 9FT ADLT (ELECTROSURGICAL) ×2
ELECTRODE REM PT RTRN 9FT ADLT (ELECTROSURGICAL) ×1 IMPLANT
GLOVE BIO SURGEON STRL SZ7 (GLOVE) ×2 IMPLANT
GLOVE BIOGEL PI IND STRL 8 (GLOVE) ×1 IMPLANT
GLOVE BIOGEL PI INDICATOR 8 (GLOVE) ×1
GLOVE SS BIOGEL STRL SZ 7.5 (GLOVE) ×1 IMPLANT
GLOVE SUPERSENSE BIOGEL SZ 7.5 (GLOVE) ×1
GOWN STRL REUS W/ TWL LRG LVL3 (GOWN DISPOSABLE) ×1 IMPLANT
GOWN STRL REUS W/ TWL XL LVL3 (GOWN DISPOSABLE) ×2 IMPLANT
GOWN STRL REUS W/TWL LRG LVL3 (GOWN DISPOSABLE) ×1
GOWN STRL REUS W/TWL XL LVL3 (GOWN DISPOSABLE) ×2
KIT MARKER MARGIN INK (KITS) IMPLANT
NEEDLE HYPO 25X1 1.5 SAFETY (NEEDLE) ×2 IMPLANT
NS IRRIG 1000ML POUR BTL (IV SOLUTION) IMPLANT
PACK BASIN DAY SURGERY FS (CUSTOM PROCEDURE TRAY) ×2 IMPLANT
PENCIL BUTTON HOLSTER BLD 10FT (ELECTRODE) ×2 IMPLANT
SLEEVE SCD COMPRESS KNEE MED (MISCELLANEOUS) ×2 IMPLANT
STAPLER VISISTAT 35W (STAPLE) IMPLANT
SUT MON AB 3-0 SH 27 (SUTURE)
SUT MON AB 3-0 SH27 (SUTURE) IMPLANT
SUT MON AB 5-0 PS2 18 (SUTURE) ×2 IMPLANT
SUT SILK 3 0 SH 30 (SUTURE) IMPLANT
SUT VIC AB 4-0 BRD 54 (SUTURE) IMPLANT
SUT VICRYL 3-0 CR8 SH (SUTURE) ×2 IMPLANT
SYR BULB 3OZ (MISCELLANEOUS) IMPLANT
SYR CONTROL 10ML LL (SYRINGE) ×2 IMPLANT
TOWEL OR 17X24 6PK STRL BLUE (TOWEL DISPOSABLE) ×2 IMPLANT
TOWEL OR NON WOVEN STRL DISP B (DISPOSABLE) IMPLANT
TUBE CONNECTING 20X1/4 (TUBING) IMPLANT
YANKAUER SUCT BULB TIP NO VENT (SUCTIONS) IMPLANT

## 2013-06-22 NOTE — Anesthesia Postprocedure Evaluation (Signed)
Anesthesia Post Note  Patient: Megan Alvarez  Procedure(s) Performed: Procedure(s) (LRB): BREAST LUMPECTOMY WITH NEEDLE LOCALIZATION (Right)  Anesthesia type: General  Patient location: PACU  Post pain: Pain level controlled and Adequate analgesia  Post assessment: Post-op Vital signs reviewed, Patient's Cardiovascular Status Stable, Respiratory Function Stable, Patent Airway and Pain level controlled  Last Vitals:  Filed Vitals:   06/22/13 1148  BP: 108/67  Pulse:   Temp:   Resp:     Post vital signs: Reviewed and stable  Level of consciousness: awake, alert  and oriented  Complications: No apparent anesthesia complications

## 2013-06-22 NOTE — Transfer of Care (Signed)
Immediate Anesthesia Transfer of Care Note  Patient: Megan Alvarez  Procedure(s) Performed: Procedure(s): BREAST LUMPECTOMY WITH NEEDLE LOCALIZATION (Right)  Patient Location: PACU  Anesthesia Type:General  Level of Consciousness: sedated  Airway & Oxygen Therapy: Patient Spontanous Breathing and Patient connected to face mask oxygen  Post-op Assessment: Report given to PACU RN and Post -op Vital signs reviewed and stable  Post vital signs: Reviewed and stable  Complications: No apparent anesthesia complications

## 2013-06-22 NOTE — Anesthesia Preprocedure Evaluation (Signed)
Anesthesia Evaluation  Patient identified by MRN, date of birth, ID band Patient awake    Airway       Dental   Pulmonary former smoker,          Cardiovascular     Neuro/Psych    GI/Hepatic   Endo/Other    Renal/GU      Musculoskeletal   Abdominal   Peds  Hematology   Anesthesia Other Findings   Reproductive/Obstetrics                           Anesthesia Physical Anesthesia Plan  ASA: I  Anesthesia Plan: General   Post-op Pain Management:    Induction: Intravenous  Airway Management Planned: LMA and Oral ETT  Additional Equipment:   Intra-op Plan:   Post-operative Plan: Extubation in OR  Informed Consent: I have reviewed the patients History and Physical, chart, labs and discussed the procedure including the risks, benefits and alternatives for the proposed anesthesia with the patient or authorized representative who has indicated his/her understanding and acceptance.     Plan Discussed with:   Anesthesia Plan Comments:         Anesthesia Quick Evaluation

## 2013-06-22 NOTE — Anesthesia Procedure Notes (Signed)
Procedure Name: LMA Insertion Date/Time: 06/22/2013 10:23 AM Performed by: Toula Moos L Pre-anesthesia Checklist: Patient identified, Emergency Drugs available, Suction available, Patient being monitored and Timeout performed Patient Re-evaluated:Patient Re-evaluated prior to inductionOxygen Delivery Method: Circle System Utilized Preoxygenation: Pre-oxygenation with 100% oxygen Intubation Type: IV induction Ventilation: Mask ventilation without difficulty LMA: LMA inserted LMA Size: 3.0 Number of attempts: 1 Airway Equipment and Method: bite block Placement Confirmation: positive ETCO2 and breath sounds checked- equal and bilateral Tube secured with: Tape Dental Injury: Teeth and Oropharynx as per pre-operative assessment

## 2013-06-22 NOTE — Discharge Instructions (Signed)
[facility] °Office Phone Number [phone] ° °BREAST BIOPSY/ PARTIAL MASTECTOMY: POST OP INSTRUCTIONS ° °Always review your discharge instruction sheet given to you by the facility where your surgery was performed. ° °IF YOU HAVE DISABILITY OR FAMILY LEAVE FORMS, YOU MUST BRING THEM TO THE OFFICE FOR PROCESSING.  DO NOT GIVE THEM TO YOUR DOCTOR. ° °1. A prescription for pain medication may be given to you upon discharge.  Take your pain medication as prescribed, if needed.  If narcotic pain medicine is not needed, then you may take acetaminophen (Tylenol) or ibuprofen (Advil) as needed. °2. Take your usually prescribed medications unless otherwise directed °3. If you need a refill on your pain medication, please contact your pharmacy.  They will contact our office to request authorization.  Prescriptions will not be filled after 5pm or on week-ends. °4. You should eat very light the first 24 hours after surgery, such as soup, crackers, pudding, etc.  Resume your normal diet the day after surgery. °5. Most patients will experience some swelling and bruising in the breast.  Ice packs and a good support bra will help.  Swelling and bruising can take several days to resolve.  °6. It is common to experience some constipation if taking pain medication after surgery.  Increasing fluid intake and taking a stool softener will usually help or prevent this problem from occurring.  A mild laxative (Milk of Magnesia or Miralax) should be taken according to package directions if there are no bowel movements after 48 hours. °7. Unless discharge instructions indicate otherwise, you may remove your bandages 24-48 hours after surgery, and you may shower at that time.  You may have steri-strips (small skin tapes) in place directly over the incision.  These strips should be left on the skin for 7-10 days.  If your surgeon used skin glue on the incision, you may shower in 24 hours.  The glue will flake off over the  next 2-3 weeks.  Any sutures or staples will be removed at the office during your follow-up visit. °8. ACTIVITIES:  You may resume regular daily activities (gradually increasing) beginning the next day.  Wearing a good support bra or sports bra minimizes pain and swelling.  You may have sexual intercourse when it is comfortable. °a. You may drive when you no longer are taking prescription pain medication, you can comfortably wear a seatbelt, and you can safely maneuver your car and apply brakes. °b. RETURN TO WORK:  ______________________________________________________________________________________ °9. You should see your doctor in the office for a follow-up appointment approximately two weeks after your surgery.  Your doctor’s nurse will typically make your follow-up appointment when she calls you with your pathology report.  Expect your pathology report 2-3 business days after your surgery.  You may call to check if you do not hear from us after three days. °10. OTHER INSTRUCTIONS: _______________________________________________________________________________________________ _____________________________________________________________________________________________________________________________________ °_____________________________________________________________________________________________________________________________________ °_____________________________________________________________________________________________________________________________________ ° °WHEN TO CALL YOUR DOCTOR: °1. Fever over 101.0 °2. Nausea and/or vomiting. °3. Extreme swelling or bruising. °4. Continued bleeding from incision. °5. Increased pain, redness, or drainage from the incision. ° °The clinic staff is available to answer your questions during regular business hours.  Please don’t hesitate to call and ask to speak to one of the nurses for clinical concerns.  If you have a medical emergency, go to the nearest  emergency room or call 911.  A surgeon from [facility] is always on call at the hospital. ° °For further questions, please visit [email or web address]  ° ° °  Post Anesthesia Home Care Instructions ° °Activity: °Get plenty of rest for the remainder of the day. A responsible adult should stay with you for 24 hours following the procedure.  °For the next 24 hours, DO NOT: °-Drive a car °-Operate machinery °-Drink alcoholic beverages °-Take any medication unless instructed by your physician °-Make any legal decisions or sign important papers. ° °Meals: °Start with liquid foods such as gelatin or soup. Progress to regular foods as tolerated. Avoid greasy, spicy, heavy foods. If nausea and/or vomiting occur, drink only clear liquids until the nausea and/or vomiting subsides. Call your physician if vomiting continues. ° °Special Instructions/Symptoms: °Your throat may feel dry or sore from the anesthesia or the breathing tube placed in your throat during surgery. If this causes discomfort, gargle with warm salt water. The discomfort should disappear within 24 hours. ° °

## 2013-06-22 NOTE — H&P (Signed)
  HPI  Patient is a very pleasant 63 year old female referred by Dr. Donavan Burnet following a recent large core needle biopsy of the right breast. The patient recently presented for routine screening mammogram. This revealed potentially a new area of microcalcifications at the 12:00 position of the right breast. Subsequent diagnostic mammogram was performed showing an 8 x 7 x 3 mm group of amorphous calcifications some of which were linear at the 12:00 position in the right breast anterior depth. There were some additional groups of calcifications posterior to this that appeared stable. Large core needle biopsy was recommended and was performed. 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 Calcifications were associated with fibrocystic change only. The patient has a history of fibrocystic breast disease and has had 2 previous open biopsies a number of years ago. She denies any current breast symptoms such as nipple discharge, lobe or pain. She has no family history of breast cancer.  Past Medical History   Diagnosis  Date   .  Cervical dysplasia    .  Osteopenia  07/2012     T score -1.8 FRAX 3%/0.2%    Past Surgical History   Procedure  Laterality  Date   .  Pelvic laparoscopy     .  Breast surgery       Biopsy-benign   .  Breast cysts removed     .  Gynecologic cryosurgery   age 55    Current Outpatient Prescriptions   Medication  Sig  Dispense  Refill   .  Calcium Carbonate-Vitamin D (CALCIUM + D PO)  Take by mouth.     .  Multiple Vitamin (MULTIVITAMIN) tablet  Take 1 tablet by mouth daily.      No current facility-administered medications for this visit.   No Known Allergies  Review of Systems  Constitutional: Negative.  Respiratory: Negative.  Cardiovascular: Negative.  Objective:   Physical Exam  BP 127/79  Pulse 66  Temp(Src) 97.8 F (36.6 C) (Oral)  Resp 20  Ht 4\' 11"  (1.499 m)  Wt 190 lb (86.183 kg)  BMI 38.35 kg/m2  SpO2  97%  General: Alert, well-developed [place] American female, in no distress  Skin: Warm and dry without rash or infection. Marland Kitchen  Lymph nodes: No cervical, supraclavicular, or inguinal nodes palpable.  Breasts: There are healed incisions bilaterally including at the 12:00 position of the right breast. There is a small approximately 1 cm tender mass superficially at the 12:00 position of the right breast about 2 cm away from the nipple, likely representing a small post biopsy hematoma  Lungs: Breath sounds clear and equal without increased work of breathing  Cardiovascular: Regular rate and rhythm without murmur. No JVD or edema. Peripheral pulses intact.  Extremities: No edema or joint swelling or deformity. No chronic venous stasis changes.   Assessment:   Abnormal mammogram and a large core needle biopsy showing sclerosed intraductal papilloma. I've recommended complete excision of the lesion to rule out the small chance of an underlying early malignancy. I discussed the nature of the procedure and its indications as well as risks of anesthetic complications, bleeding and infection. All the patient's and her husband questions were answered and she desires to proceed.   Plan:   Needle localized right breast lumpectomy as an outpatient under general anesthesia

## 2013-06-22 NOTE — Op Note (Signed)
Preoperative Diagnosis: right breast papilloma  Postoprative Diagnosis: right breast papilloma  Procedure: Procedure(s): BREAST LUMPECTOMY WITH NEEDLE LOCALIZATION   Surgeon: Excell Seltzer T   Assistants:  none  Anesthesia:  General LMA anesthesia  Indications: patient is a 63 year old female who recently underwent large core needle biopsy of a small area of clustered calcifications in the right retroareolar area at the 12:00 position.  This is revealed intraductal papilloma. After discussion of options and risks of surgery detailed elsewhere we have elected to proceed with needle localized excision to rule out the small chance of underlying malignancy.  Procedure Detail:  Following accurate needle localization in the breast center, the patient is brought to the operating room and placed in the supine position on the operating table and laryngeal mask general anesthesia induced. The right breast was widely sterilely prepped and draped. Patient timeout was performed. The procedure was verified. She received preoperative IV antibiotics and PAS were placed. A curvilinear incision was made near the wire insertion site and dissection carried down to the saphenous tissue to the breast capsule. The wire was brought into the incision and then excised a globular specimen around the shaft and the tip of the wire with cautery in order to widely excise the area in question. Specimen mammography showed the wire and clip within the center of the specimen. It had been oriented with sutures and was sent for permanent pathology. The cavity was marked with clips. The soft tissue was infiltrated with Marcaine with epinephrine. Hemostasis was obtained with cautery and several Vicryl sutures. The breast and subcutaneous tissue was closed with interrupted Vicryl the skin with subcuticular 4-0 Monocryl and Dermabond. Sponge needle and instrument counts were correct    Findings: As above  Estimated Blood Loss:   Minimal         Drains: none  Blood Given: none          Specimens: right breast lumpectomy oriented with sutures        Complications:  * No complications entered in OR log *         Disposition: PACU - hemodynamically stable.         Condition: stable

## 2013-06-23 ENCOUNTER — Telehealth (INDEPENDENT_AMBULATORY_CARE_PROVIDER_SITE_OTHER): Payer: Self-pay | Admitting: General Surgery

## 2013-06-23 NOTE — Telephone Encounter (Signed)
Called pt and discussed path report 

## 2013-06-24 ENCOUNTER — Encounter (HOSPITAL_BASED_OUTPATIENT_CLINIC_OR_DEPARTMENT_OTHER): Payer: Self-pay | Admitting: General Surgery

## 2013-07-09 ENCOUNTER — Encounter (INDEPENDENT_AMBULATORY_CARE_PROVIDER_SITE_OTHER): Payer: Self-pay | Admitting: General Surgery

## 2013-07-09 ENCOUNTER — Ambulatory Visit (INDEPENDENT_AMBULATORY_CARE_PROVIDER_SITE_OTHER): Payer: BC Managed Care – PPO | Admitting: General Surgery

## 2013-07-09 VITALS — BP 116/70 | HR 71 | Temp 97.0°F | Resp 16 | Wt 191.2 lb

## 2013-07-09 DIAGNOSIS — D249 Benign neoplasm of unspecified breast: Secondary | ICD-10-CM

## 2013-07-09 NOTE — Progress Notes (Signed)
History: The patient returns for postop check following right breast lumpectomy for cornual biopsy showing fragment of papilloma. She reports no problems from the surgery.  Exam: BP 116/70  Pulse 71  Temp(Src) 97 F (36.1 C) (Temporal)  Resp 16  Wt 191 lb 3.2 oz (86.728 kg) General: Appears well Breasts: Right breast incision healing well without complication  Pathology confirmed benign papilloma and fibrocystic changes with no evidence of malignancy. She was given a copy of the pathology report.  Assessment and plan: Doing well following lumpectomy for papilloma.  She is encouraged to continue routine screening and return here as needed.

## 2013-07-20 ENCOUNTER — Encounter: Payer: Self-pay | Admitting: Gynecology

## 2013-07-20 ENCOUNTER — Ambulatory Visit (INDEPENDENT_AMBULATORY_CARE_PROVIDER_SITE_OTHER): Payer: BC Managed Care – PPO | Admitting: Gynecology

## 2013-07-20 ENCOUNTER — Other Ambulatory Visit (HOSPITAL_COMMUNITY)
Admission: RE | Admit: 2013-07-20 | Discharge: 2013-07-20 | Disposition: A | Discharge status: Home / Self Care | Payer: BC Managed Care – PPO | Source: Ambulatory Visit | Attending: Gynecology | Admitting: Gynecology

## 2013-07-20 VITALS — BP 122/78 | Ht 60.0 in | Wt 193.0 lb

## 2013-07-20 DIAGNOSIS — Z01419 Encounter for gynecological examination (general) (routine) without abnormal findings: Secondary | ICD-10-CM

## 2013-07-20 DIAGNOSIS — M8580 Other specified disorders of bone density and structure, unspecified site: Secondary | ICD-10-CM

## 2013-07-20 DIAGNOSIS — M949 Disorder of cartilage, unspecified: Secondary | ICD-10-CM

## 2013-07-20 DIAGNOSIS — N952 Postmenopausal atrophic vaginitis: Secondary | ICD-10-CM

## 2013-07-20 DIAGNOSIS — Z1151 Encounter for screening for human papillomavirus (HPV): Secondary | ICD-10-CM | POA: Insufficient documentation

## 2013-07-20 DIAGNOSIS — M899 Disorder of bone, unspecified: Secondary | ICD-10-CM

## 2013-07-20 NOTE — Progress Notes (Signed)
Megan Alvarez 06/06/1950 [id number]        63 y.o.  [id number] for annual exam.  Doing well. Several issues noted below.  Past medical history,surgical history, problem list, medications, allergies, family history and social history were all reviewed and documented as reviewed in the EPIC chart.  ROS:  12 system ROS performed with pertinent positives and negatives included in the history, assessment and plan.  Included Systems: General, HEENT, Neck, Cardiovascular, Pulmonary, Gastrointestinal, Genitourinary, Musculoskeletal, Dermatologic, Endocrine, Hematological, Neurologic, Psychiatric Additional significant findings :  None   Exam: [name] assistant Filed Vitals:   07/20/13 0935  BP: 122/78  Height: 5' (1.524 m)  Weight: 193 lb (87.544 kg)   General appearance:  Normal affect, orientation and appearance. Skin: Grossly normal HEENT: Without gross lesions.  No cervical or supraclavicular adenopathy. Thyroid normal.  Lungs:  Clear without wheezing, rales or rhonchi Cardiac: RR, without RMG Abdominal:  Soft, nontender, without masses, guarding, rebound, organomegaly or hernia Breasts:  Examined lying and sitting without masses, retractions, discharge or axillary adenopathy. Pelvic:  Ext/BUS/vagina with generalized atrophic changes  Cervix with atrophic changes. Pap/HPV  Uterus anteverted, normal size, shape and contour, midline and mobile nontender   Adnexa  Without masses or tenderness    Anus and perineum  Normal   Rectovaginal  Normal sphincter tone without palpated masses or tenderness.    Assessment/Plan:  63 y.o. [id number] female for annual exam.   1. Postmenopausal/atrophic genital changes. Patient's asymptomatic without significant hot flashes, night sweats, vaginal dryness. Is not sexually active. No vaginal bleeding. Will continue to monitor. Call if any vaginal bleeding. 2. Osteopenia. DEXA 07/2012 T score -1.8. FRAX 3%/0.2%. Had been diagnosed with osteoporosis in the past but  baseline was considered black and she was recalculated with a white baseline as recommended and is osteopenic. Plan to repeat bone density next year. Increase calcium vitamin D reviewed. 3. Mammography 02/2013. Had recent needle localization and lumpectomy for calcifications and papilloma. Pathology was all benign. Recommended repeat mammogram interval was 6 months by radiology. Patient will followup for this. SBE monthly reviewed. 4. Pap smear 2012./HPV today. No history of abnormal Pap smears previously. Plan repeat Pap smear in 3-5 year interval for current screening guidelines. 5. Colonoscopy 5 years ago. Patient has colonoscopy scheduled now. Patient will followup for this. 6. Health maintenance. No lab work done as patient reports recently done through her primary physician's office. Followup one year, sooner as needed.   Note: This document was prepared with digital dictation and possible smart phrase technology. Any transcriptional errors that result from this process are unintentional.   Anastasio Auerbach MD, 9:59 AM 07/20/2013

## 2013-07-20 NOTE — Addendum Note (Signed)
Addended by: Nelva Nay on: 07/20/2013 10:53 AM   Modules accepted: Orders

## 2013-07-20 NOTE — Patient Instructions (Signed)
Followup in one year for annual exam, sooner as needed. Report any vaginal bleeding.  You may obtain a copy of any labs that were done today by logging onto MyChart as outlined in the instructions provided with your AVS (after visit summary). The office will not call with normal lab results but certainly if there are any significant abnormalities then we will contact you.   Health Maintenance, Female A healthy lifestyle and preventative care can promote health and wellness.  Maintain regular health, dental, and eye exams.  Eat a healthy diet. Foods like vegetables, fruits, whole grains, low-fat dairy products, and lean protein foods contain the nutrients you need without too many calories. Decrease your intake of foods high in solid fats, added sugars, and salt. Get information about a proper diet from your caregiver, if necessary.  Regular physical exercise is one of the most important things you can do for your health. Most adults should get at least 150 minutes of moderate-intensity exercise (any activity that increases your heart rate and causes you to sweat) each week. In addition, most adults need muscle-strengthening exercises on 2 or more days a week.   Maintain a healthy weight. The body mass index (BMI) is a screening tool to identify possible weight problems. It provides an estimate of body fat based on height and weight. Your caregiver can help determine your BMI, and can help you achieve or maintain a healthy weight. For adults 20 years and older:  A BMI below 18.5 is considered underweight.  A BMI of 18.5 to 24.9 is normal.  A BMI of 25 to 29.9 is considered overweight.  A BMI of 30 and above is considered obese.  Maintain normal blood lipids and cholesterol by exercising and minimizing your intake of saturated fat. Eat a balanced diet with plenty of fruits and vegetables. Blood tests for lipids and cholesterol should begin at age 5 and be repeated every 5 years. If your lipid  or cholesterol levels are high, you are over 50, or you are a high risk for heart disease, you may need your cholesterol levels checked more frequently.Ongoing high lipid and cholesterol levels should be treated with medicines if diet and exercise are not effective.  If you smoke, find out from your caregiver how to quit. If you do not use tobacco, do not start.  Lung cancer screening is recommended for adults aged 78 80 years who are at high risk for developing lung cancer because of a history of smoking. Yearly low-dose computed tomography (CT) is recommended for people who have at least a 30-pack-year history of smoking and are a current smoker or have quit within the past 15 years. A pack year of smoking is smoking an average of 1 pack of cigarettes a day for 1 year (for example: 1 pack a day for 30 years or 2 packs a day for 15 years). Yearly screening should continue until the smoker has stopped smoking for at least 15 years. Yearly screening should also be stopped for people who develop a health problem that would prevent them from having lung cancer treatment.  If you are pregnant, do not drink alcohol. If you are breastfeeding, be very cautious about drinking alcohol. If you are not pregnant and choose to drink alcohol, do not exceed 1 drink per day. One drink is considered to be 12 ounces (355 mL) of beer, 5 ounces (148 mL) of wine, or 1.5 ounces (44 mL) of liquor.  Avoid use of street drugs. Do not  share needles with anyone. Ask for help if you need support or instructions about stopping the use of drugs.  High blood pressure causes heart disease and increases the risk of stroke. Blood pressure should be checked at least every 1 to 2 years. Ongoing high blood pressure should be treated with medicines, if weight loss and exercise are not effective.  If you are 48 to 63 years old, ask your caregiver if you should take aspirin to prevent strokes.  Diabetes screening involves taking a blood  sample to check your fasting blood sugar level. This should be done once every 3 years, after age 47, if you are within normal weight and without risk factors for diabetes. Testing should be considered at a younger age or be carried out more frequently if you are overweight and have at least 1 risk factor for diabetes.  Breast cancer screening is essential preventative care for women. You should practice "breast self-awareness." This means understanding the normal appearance and feel of your breasts and may include breast self-examination. Any changes detected, no matter how small, should be reported to a caregiver. Women in their 57s and 30s should have a clinical breast exam (CBE) by a caregiver as part of a regular health exam every 1 to 3 years. After age 83, women should have a CBE every year. Starting at age 36, women should consider having a mammogram (breast X-ray) every year. Women who have a family history of breast cancer should talk to their caregiver about genetic screening. Women at a high risk of breast cancer should talk to their caregiver about having an MRI and a mammogram every year.  Breast cancer gene (BRCA)-related cancer risk assessment is recommended for women who have family members with BRCA-related cancers. BRCA-related cancers include breast, ovarian, tubal, and peritoneal cancers. Having family members with these cancers may be associated with an increased risk for harmful changes (mutations) in the breast cancer genes BRCA1 and BRCA2. Results of the assessment will determine the need for genetic counseling and BRCA1 and BRCA2 testing.  The Pap test is a screening test for cervical cancer. Women should have a Pap test starting at age 42. Between ages 12 and 46, Pap tests should be repeated every 2 years. Beginning at age 92, you should have a Pap test every 3 years as long as the past 3 Pap tests have been normal. If you had a hysterectomy for a problem that was not cancer or a  condition that could lead to cancer, then you no longer need Pap tests. If you are between ages 49 and 32, and you have had normal Pap tests going back 10 years, you no longer need Pap tests. If you have had past treatment for cervical cancer or a condition that could lead to cancer, you need Pap tests and screening for cancer for at least 20 years after your treatment. If Pap tests have been discontinued, risk factors (such as a new sexual partner) need to be reassessed to determine if screening should be resumed. Some women have medical problems that increase the chance of getting cervical cancer. In these cases, your caregiver may recommend more frequent screening and Pap tests.  The human papillomavirus (HPV) test is an additional test that may be used for cervical cancer screening. The HPV test looks for the virus that can cause the cell changes on the cervix. The cells collected during the Pap test can be tested for HPV. The HPV test could be used to screen  women aged 48 years and older, and should be used in women of any age who have unclear Pap test results. After the age of 39, women should have HPV testing at the same frequency as a Pap test.  Colorectal cancer can be detected and often prevented. Most routine colorectal cancer screening begins at the age of 75 and continues through age 84. However, your caregiver may recommend screening at an earlier age if you have risk factors for colon cancer. On a yearly basis, your caregiver may provide home test kits to check for hidden blood in the stool. Use of a small camera at the end of a tube, to directly examine the colon (sigmoidoscopy or colonoscopy), can detect the earliest forms of colorectal cancer. Talk to your caregiver about this at age 63, when routine screening begins. Direct examination of the colon should be repeated every 5 to 10 years through age 37, unless early forms of pre-cancerous polyps or small growths are found.  Hepatitis C blood  testing is recommended for all people born from [birth year] through 1965 and any individual with known risks for hepatitis C.  Practice safe sex. Use condoms and avoid high-risk sexual practices to reduce the spread of sexually transmitted infections (STIs). Sexually active women aged 42 and younger should be checked for Chlamydia, which is a common sexually transmitted infection. Older women with new or multiple partners should also be tested for Chlamydia. Testing for other STIs is recommended if you are sexually active and at increased risk.  Osteoporosis is a disease in which the bones lose minerals and strength with aging. This can result in serious bone fractures. The risk of osteoporosis can be identified using a bone density scan. Women ages 37 and over and women at risk for fractures or osteoporosis should discuss screening with their caregivers. Ask your caregiver whether you should be taking a calcium supplement or vitamin D to reduce the rate of osteoporosis.  Menopause can be associated with physical symptoms and risks. Hormone replacement therapy is available to decrease symptoms and risks. You should talk to your caregiver about whether hormone replacement therapy is right for you.  Use sunscreen. Apply sunscreen liberally and repeatedly throughout the day. You should seek shade when your shadow is shorter than you. Protect yourself by wearing long sleeves, pants, a wide-brimmed hat, and sunglasses year round, whenever you are outdoors.  Notify your caregiver of new moles or changes in moles, especially if there is a change in shape or color. Also notify your caregiver if a mole is larger than the size of a pencil eraser.  Stay current with your immunizations. Document Released: 08/21/2010 Document Revised: 06/02/2012 Document Reviewed: 08/21/2010 [facility] Patient Information 2014 [employer].

## 2013-07-21 ENCOUNTER — Encounter: Payer: Self-pay | Admitting: Gynecology

## 2013-12-21 ENCOUNTER — Encounter: Payer: Self-pay | Admitting: Gynecology

## 2014-04-27 ENCOUNTER — Other Ambulatory Visit: Payer: Self-pay | Admitting: Internal Medicine

## 2014-04-27 ENCOUNTER — Other Ambulatory Visit: Payer: Self-pay

## 2014-04-27 DIAGNOSIS — R921 Mammographic calcification found on diagnostic imaging of breast: Secondary | ICD-10-CM

## 2014-04-27 DIAGNOSIS — Z1231 Encounter for screening mammogram for malignant neoplasm of breast: Secondary | ICD-10-CM

## 2014-04-30 ENCOUNTER — Ambulatory Visit: Payer: BC Managed Care – PPO

## 2014-05-03 ENCOUNTER — Ambulatory Visit
Admission: RE | Admit: 2014-05-03 | Discharge: 2014-05-03 | Disposition: A | Discharge status: Home / Self Care | Payer: BC Managed Care – PPO | Source: Ambulatory Visit | Attending: Internal Medicine | Admitting: Internal Medicine

## 2014-05-03 ENCOUNTER — Other Ambulatory Visit: Payer: Self-pay | Admitting: Internal Medicine

## 2014-05-03 DIAGNOSIS — N6320 Unspecified lump in the left breast, unspecified quadrant: Secondary | ICD-10-CM

## 2014-05-03 DIAGNOSIS — R921 Mammographic calcification found on diagnostic imaging of breast: Secondary | ICD-10-CM

## 2014-08-13 ENCOUNTER — Encounter: Payer: BC Managed Care – PPO | Admitting: Gynecology

## 2015-03-31 ENCOUNTER — Other Ambulatory Visit: Payer: Self-pay

## 2015-03-31 DIAGNOSIS — Z1231 Encounter for screening mammogram for malignant neoplasm of breast: Secondary | ICD-10-CM

## 2015-05-05 ENCOUNTER — Ambulatory Visit
Admission: RE | Admit: 2015-05-05 | Discharge: 2015-05-05 | Disposition: A | Discharge status: Home / Self Care | Payer: BC Managed Care – PPO | Source: Ambulatory Visit

## 2015-05-05 DIAGNOSIS — Z1231 Encounter for screening mammogram for malignant neoplasm of breast: Secondary | ICD-10-CM

## 2015-07-25 ENCOUNTER — Telehealth: Payer: Self-pay | Admitting: *Deleted

## 2015-07-25 ENCOUNTER — Ambulatory Visit (INDEPENDENT_AMBULATORY_CARE_PROVIDER_SITE_OTHER): Payer: Medicare Other | Admitting: Gynecology

## 2015-07-25 ENCOUNTER — Encounter: Payer: Self-pay | Admitting: Gynecology

## 2015-07-25 VITALS — BP 122/78 | Ht 59.0 in | Wt 194.0 lb

## 2015-07-25 DIAGNOSIS — N63 Unspecified lump in unspecified breast: Secondary | ICD-10-CM

## 2015-07-25 DIAGNOSIS — N952 Postmenopausal atrophic vaginitis: Secondary | ICD-10-CM | POA: Diagnosis not present

## 2015-07-25 DIAGNOSIS — Z01419 Encounter for gynecological examination (general) (routine) without abnormal findings: Secondary | ICD-10-CM | POA: Diagnosis not present

## 2015-07-25 DIAGNOSIS — M8580 Other specified disorders of bone density and structure, unspecified site: Secondary | ICD-10-CM

## 2015-07-25 NOTE — Patient Instructions (Addendum)
Office will call you to schedule the ultrasound and mammogram of the left breast as we discussed. Call my office if you have not heard from them within 1 week.  Follow up in one year for annual exam, sooner as needed.

## 2015-07-25 NOTE — Telephone Encounter (Signed)
Orders placed they will contact pt to schedule. 

## 2015-07-25 NOTE — Progress Notes (Signed)
    Megan Alvarez Jan 16, 1951 [id number]        65 y.o.  [id number]  for breast and pelvic exam. Doing well without complaints  Past medical history,surgical history, problem list, medications, allergies, family history and social history were all reviewed and documented as reviewed in the EPIC chart.  ROS:  Performed with pertinent positives and negatives included in the history, assessment and plan.   Additional significant findings :  none   Exam: Caryn Bee assistant Filed Vitals:   07/25/15 1110  BP: 122/78  Height: 4\' 11"  (1.499 m)  Weight: 194 lb (87.998 kg)   General appearance:  Normal affect, orientation and appearance. Skin: Grossly normal HEENT: Without gross lesions.  No cervical or supraclavicular adenopathy. Thyroid normal.  Lungs:  Clear without wheezing, rales or rhonchi Cardiac: RR, without RMG Abdominal:  Soft, nontender, without masses, guarding, rebound, organomegaly or hernia Breasts:  Examined lying and sitting. Right without masses, retractions, discharge or axillary adenopathy.  Left with 2 cm density 11 to 12:00 position 2 finger breaths off the areola. No overlying skin changes, nipple discharge or axillary adenopathy Pelvic:  Ext/BUS/vagina with atrophic changes  Cervix with atrophic changes  Uterus difficult to palpate but no gross masses or tenderness  Adnexa without masses or tenderness    Anus and perineum normal   Rectovaginal normal sphincter tone without palpated masses or tenderness.    Assessment/Plan:  65 y.o. [id number] female for breast and pelvic exam.   1. Postmenopausal/atrophic genital changes. No significant hot flushes, night sweats, vaginal dryness or any vaginal bleeding. Continue to monitor and report any issues or bleeding. 2. Left breast nodularity 11:00 to 12:00 position as described above. Recent mammogram normal March 2017. We'll proceed with diagnostic mammography/ultrasound of this area. Patient knows if she does not hear from  radiology to schedule within a week she will call my office. Differential reviewed with her to include benign fibroglandular asymmetry up to and including cancer. 3. Osteopenia. Patient had DEXA 2 weeks ago and was told she had osteopenia. Is taking extra calcium vitamin D. No other treatment was recommended. She'll continue to follow up with her primary physician in reference to management of this. 4. Colonoscopy 2015. Repeat at their recommended interval. 5. Pap smear/HPV 07/2013 negative. No Pap smear done today. Plan repeat at 5 year interval per current screening guidelines. No history of abnormal Pap smears previously. 6. Health maintenance. No routine lab work done as she reports this done elsewhere. Follow up for mammogram and ultrasound as discussed above otherwise 1 year, sooner as needed.   Anastasio Auerbach MD, 11:35 AM 07/25/2015

## 2015-07-25 NOTE — Telephone Encounter (Signed)
-----   Message from Anastasio Auerbach, MD sent at 07/25/2015 11:39 AM EDT ----- Schedule diagnostic mammogram and ultrasound left breast reference nodularity palpated 11 to 12:00 position left breast 2 finger breaths off areola recent mammography reported negative March 2017

## 2015-07-27 ENCOUNTER — Ambulatory Visit
Admission: RE | Admit: 2015-07-27 | Discharge: 2015-07-27 | Disposition: A | Discharge status: Home / Self Care | Payer: Medicare Other | Source: Ambulatory Visit | Attending: Gynecology | Admitting: Gynecology

## 2015-07-27 DIAGNOSIS — N63 Unspecified lump in unspecified breast: Secondary | ICD-10-CM

## 2015-07-28 NOTE — Telephone Encounter (Signed)
appt on 07/28/12 @ 12:40pm

## 2015-07-29 ENCOUNTER — Other Ambulatory Visit: Payer: BC Managed Care – PPO

## 2016-05-17 ENCOUNTER — Other Ambulatory Visit: Payer: Self-pay | Admitting: Gynecology

## 2016-05-17 DIAGNOSIS — Z1231 Encounter for screening mammogram for malignant neoplasm of breast: Secondary | ICD-10-CM

## 2016-07-26 ENCOUNTER — Ambulatory Visit
Admission: RE | Admit: 2016-07-26 | Discharge: 2016-07-26 | Disposition: A | Discharge status: Home / Self Care | Payer: Medicare Other | Source: Ambulatory Visit | Attending: Gynecology | Admitting: Gynecology

## 2016-07-26 DIAGNOSIS — Z1231 Encounter for screening mammogram for malignant neoplasm of breast: Secondary | ICD-10-CM

## 2016-07-31 ENCOUNTER — Encounter: Payer: Self-pay | Admitting: Gynecology

## 2016-07-31 ENCOUNTER — Ambulatory Visit (INDEPENDENT_AMBULATORY_CARE_PROVIDER_SITE_OTHER): Payer: Medicare Other | Admitting: Gynecology

## 2016-07-31 VITALS — BP 124/82 | Ht 59.0 in | Wt 205.0 lb

## 2016-07-31 DIAGNOSIS — N952 Postmenopausal atrophic vaginitis: Secondary | ICD-10-CM | POA: Diagnosis not present

## 2016-07-31 DIAGNOSIS — M8580 Other specified disorders of bone density and structure, unspecified site: Secondary | ICD-10-CM | POA: Diagnosis not present

## 2016-07-31 DIAGNOSIS — Z01411 Encounter for gynecological examination (general) (routine) with abnormal findings: Secondary | ICD-10-CM

## 2016-07-31 NOTE — Progress Notes (Signed)
    MALIA CORSI February 01, 1951 [id number]        66 y.o.  [id number] for annual exam.  Doing well without gynecologic complaints  Past medical history,surgical history, problem list, medications, allergies, family history and social history were all reviewed and documented as reviewed in the EPIC chart.  ROS:  Performed with pertinent positives and negatives included in the history, assessment and plan.   Additional significant findings :  None   Exam: Caryn Bee assistant Vitals:   07/31/16 1417  BP: 124/82  Weight: 205 lb (93 kg)  Height: 4\' 11"  (1.499 m)   Body mass index is 41.4 kg/m.  General appearance:  Normal affect, orientation and appearance. Skin: Grossly normal HEENT: Without gross lesions.  No cervical or supraclavicular adenopathy. Thyroid normal.  Lungs:  Clear without wheezing, rales or rhonchi Cardiac: RR, without RMG Abdominal:  Soft, nontender, without masses, guarding, rebound, organomegaly or hernia Breasts:  Examined lying and sitting without masses, retractions, discharge or axillary adenopathy. Pelvic:  Ext, BUS, Vagina: With atrophic changes  Cervix: With atrophic changes  Uterus: Difficult to palpate but no gross masses or tenderness  Adnexa: Without gross masses or tenderness    Anus and perineum: Normal   Rectovaginal: Normal sphincter tone without palpated masses or tenderness.    Assessment/Plan:  66 y.o. [id number] female for annual exam.   1. Postmenopausal/atrophic genital changes. No significant hot flushes, night sweats, vaginal dryness or any vaginal bleeding. Continue to monitor and report any issues or bleeding. 2. Mammography 07/2016. History of nodularity left breast last year with ultrasound showing cyst. Area has resolved to the patient's self breast exam. Breast exam today is normal with no palpable nodularity. Continue with monthly self breast exams. Follow up mammogram in one year. 3. Osteopenia. DEXA 2014 T score -1.8 FRAX 3%/0.2%. Plan  repeat DEXA now at 4 year interval. Patient will schedule follow up for this. 4. Colonoscopy 2015. Repeat at their recommended interval. 5. Pap smear/HPV 07/2013. No Pap smear done today. No history of significant abnormal Pap smears. Per current screening guidelines plan repeat Pap smear at 5 year interval. 6. Health maintenance. No routine lab work done as patient reports this done elsewhere. Follow up 1 year, sooner as needed.   Anastasio Auerbach MD, 2:45 PM 07/31/2016

## 2016-07-31 NOTE — Patient Instructions (Addendum)
Follow up for bone density as scheduled  Annual follow up exam in one year

## 2017-07-08 ENCOUNTER — Other Ambulatory Visit: Payer: Self-pay | Admitting: Internal Medicine

## 2017-07-08 DIAGNOSIS — Z1231 Encounter for screening mammogram for malignant neoplasm of breast: Secondary | ICD-10-CM

## 2017-07-30 ENCOUNTER — Ambulatory Visit
Admission: RE | Admit: 2017-07-30 | Discharge: 2017-07-30 | Disposition: A | Discharge status: Home / Self Care | Payer: Medicare Other | Source: Ambulatory Visit | Attending: Internal Medicine | Admitting: Internal Medicine

## 2017-07-30 DIAGNOSIS — Z1231 Encounter for screening mammogram for malignant neoplasm of breast: Secondary | ICD-10-CM

## 2017-08-01 ENCOUNTER — Encounter: Payer: Medicare Other | Admitting: Gynecology

## 2018-04-17 ENCOUNTER — Other Ambulatory Visit: Payer: Self-pay | Admitting: Internal Medicine

## 2018-04-17 DIAGNOSIS — Z1231 Encounter for screening mammogram for malignant neoplasm of breast: Secondary | ICD-10-CM

## 2018-08-05 ENCOUNTER — Ambulatory Visit
Admission: RE | Admit: 2018-08-05 | Discharge: 2018-08-05 | Disposition: A | Discharge status: Home / Self Care | Payer: Medicare Other | Source: Ambulatory Visit | Attending: Internal Medicine | Admitting: Internal Medicine

## 2018-08-05 ENCOUNTER — Other Ambulatory Visit: Payer: Self-pay

## 2018-08-05 DIAGNOSIS — Z1231 Encounter for screening mammogram for malignant neoplasm of breast: Secondary | ICD-10-CM

## 2018-08-11 ENCOUNTER — Other Ambulatory Visit: Payer: Self-pay | Admitting: *Deleted

## 2018-08-11 DIAGNOSIS — Z20822 Contact with and (suspected) exposure to covid-19: Secondary | ICD-10-CM

## 2018-08-17 LAB — NOVEL CORONAVIRUS, NAA: SARS-CoV-2, NAA: NOT DETECTED

## 2018-11-26 ENCOUNTER — Encounter: Payer: Self-pay | Admitting: Gynecology

## 2019-07-02 ENCOUNTER — Other Ambulatory Visit: Payer: Self-pay | Admitting: Internal Medicine

## 2019-07-02 DIAGNOSIS — Z1231 Encounter for screening mammogram for malignant neoplasm of breast: Secondary | ICD-10-CM

## 2019-08-06 ENCOUNTER — Other Ambulatory Visit: Payer: Self-pay

## 2019-08-06 ENCOUNTER — Ambulatory Visit
Admission: RE | Admit: 2019-08-06 | Discharge: 2019-08-06 | Disposition: A | Discharge status: Home / Self Care | Payer: Medicare PPO | Source: Ambulatory Visit | Attending: Internal Medicine | Admitting: Internal Medicine

## 2019-08-06 DIAGNOSIS — Z1231 Encounter for screening mammogram for malignant neoplasm of breast: Secondary | ICD-10-CM

## 2019-10-14 ENCOUNTER — Other Ambulatory Visit: Payer: Medicare PPO

## 2019-10-14 ENCOUNTER — Other Ambulatory Visit: Payer: Self-pay | Admitting: Critical Care Medicine

## 2019-10-14 DIAGNOSIS — Z20822 Contact with and (suspected) exposure to covid-19: Secondary | ICD-10-CM

## 2019-10-16 LAB — SARS-COV-2, NAA 2 DAY TAT

## 2019-10-16 LAB — NOVEL CORONAVIRUS, NAA: SARS-CoV-2, NAA: NOT DETECTED

## 2020-03-24 DIAGNOSIS — M8589 Other specified disorders of bone density and structure, multiple sites: Secondary | ICD-10-CM | POA: Diagnosis not present

## 2020-03-24 DIAGNOSIS — M1990 Unspecified osteoarthritis, unspecified site: Secondary | ICD-10-CM | POA: Diagnosis not present

## 2020-07-05 ENCOUNTER — Other Ambulatory Visit: Payer: Self-pay | Admitting: Internal Medicine

## 2020-07-05 DIAGNOSIS — Z1231 Encounter for screening mammogram for malignant neoplasm of breast: Secondary | ICD-10-CM

## 2020-08-31 ENCOUNTER — Ambulatory Visit
Admission: RE | Admit: 2020-08-31 | Discharge: 2020-08-31 | Disposition: A | Discharge status: Home / Self Care | Payer: Medicare PPO | Source: Ambulatory Visit | Attending: Internal Medicine | Admitting: Internal Medicine

## 2020-08-31 ENCOUNTER — Other Ambulatory Visit: Payer: Self-pay

## 2020-08-31 DIAGNOSIS — Z1231 Encounter for screening mammogram for malignant neoplasm of breast: Secondary | ICD-10-CM | POA: Diagnosis not present

## 2020-11-23 DIAGNOSIS — H2513 Age-related nuclear cataract, bilateral: Secondary | ICD-10-CM | POA: Diagnosis not present

## 2020-11-23 DIAGNOSIS — H3554 Dystrophies primarily involving the retinal pigment epithelium: Secondary | ICD-10-CM | POA: Diagnosis not present

## 2021-02-15 DIAGNOSIS — R739 Hyperglycemia, unspecified: Secondary | ICD-10-CM | POA: Diagnosis not present

## 2021-02-15 DIAGNOSIS — M8580 Other specified disorders of bone density and structure, unspecified site: Secondary | ICD-10-CM | POA: Diagnosis not present

## 2021-02-16 DIAGNOSIS — R739 Hyperglycemia, unspecified: Secondary | ICD-10-CM | POA: Diagnosis not present

## 2021-02-22 DIAGNOSIS — J302 Other seasonal allergic rhinitis: Secondary | ICD-10-CM | POA: Diagnosis not present

## 2021-02-22 DIAGNOSIS — R3915 Urgency of urination: Secondary | ICD-10-CM | POA: Diagnosis not present

## 2021-02-22 DIAGNOSIS — Z1339 Encounter for screening examination for other mental health and behavioral disorders: Secondary | ICD-10-CM | POA: Diagnosis not present

## 2021-02-22 DIAGNOSIS — Z1331 Encounter for screening for depression: Secondary | ICD-10-CM | POA: Diagnosis not present

## 2021-02-22 DIAGNOSIS — E669 Obesity, unspecified: Secondary | ICD-10-CM | POA: Diagnosis not present

## 2021-02-22 DIAGNOSIS — Z0000 Encounter for general adult medical examination without abnormal findings: Secondary | ICD-10-CM | POA: Diagnosis not present

## 2021-02-22 DIAGNOSIS — R300 Dysuria: Secondary | ICD-10-CM | POA: Diagnosis not present

## 2021-07-31 ENCOUNTER — Other Ambulatory Visit: Payer: Self-pay | Admitting: Internal Medicine

## 2021-07-31 DIAGNOSIS — Z1231 Encounter for screening mammogram for malignant neoplasm of breast: Secondary | ICD-10-CM

## 2021-09-01 ENCOUNTER — Ambulatory Visit
Admission: RE | Admit: 2021-09-01 | Discharge: 2021-09-01 | Disposition: A | Discharge status: Home / Self Care | Payer: Medicare PPO | Source: Ambulatory Visit | Attending: Internal Medicine | Admitting: Internal Medicine

## 2021-09-01 DIAGNOSIS — Z1231 Encounter for screening mammogram for malignant neoplasm of breast: Secondary | ICD-10-CM

## 2021-09-05 ENCOUNTER — Other Ambulatory Visit: Payer: Self-pay | Admitting: Internal Medicine

## 2021-09-05 DIAGNOSIS — R928 Other abnormal and inconclusive findings on diagnostic imaging of breast: Secondary | ICD-10-CM

## 2021-09-21 ENCOUNTER — Ambulatory Visit
Admission: RE | Admit: 2021-09-21 | Discharge: 2021-09-21 | Disposition: A | Discharge status: Home / Self Care | Payer: Medicare PPO | Source: Ambulatory Visit | Attending: Internal Medicine | Admitting: Internal Medicine

## 2021-09-21 ENCOUNTER — Other Ambulatory Visit: Payer: Self-pay | Admitting: Internal Medicine

## 2021-09-21 DIAGNOSIS — R921 Mammographic calcification found on diagnostic imaging of breast: Secondary | ICD-10-CM

## 2021-09-21 DIAGNOSIS — R928 Other abnormal and inconclusive findings on diagnostic imaging of breast: Secondary | ICD-10-CM

## 2021-09-27 ENCOUNTER — Ambulatory Visit
Admission: RE | Admit: 2021-09-27 | Discharge: 2021-09-27 | Disposition: A | Discharge status: Home / Self Care | Payer: Medicare PPO | Source: Ambulatory Visit | Attending: Internal Medicine | Admitting: Internal Medicine

## 2021-09-27 DIAGNOSIS — N6011 Diffuse cystic mastopathy of right breast: Secondary | ICD-10-CM | POA: Diagnosis not present

## 2021-09-27 DIAGNOSIS — R921 Mammographic calcification found on diagnostic imaging of breast: Secondary | ICD-10-CM | POA: Diagnosis not present

## 2021-11-07 ENCOUNTER — Ambulatory Visit: Payer: Medicare PPO | Admitting: Podiatry

## 2021-11-07 DIAGNOSIS — M79675 Pain in left toe(s): Secondary | ICD-10-CM | POA: Diagnosis not present

## 2021-11-07 DIAGNOSIS — M79674 Pain in right toe(s): Secondary | ICD-10-CM

## 2021-11-07 DIAGNOSIS — B351 Tinea unguium: Secondary | ICD-10-CM

## 2021-11-07 NOTE — Progress Notes (Signed)
   Chief Complaint  Patient presents with   Callouses    Patient is here for bilateral toenail fungus, right foot callous on bottom of the foot.patient states that she has been treating her feet at home but is to do now.   Tinea Pedis    SUBJECTIVE Patient presents to office today complaining of elongated, thickened nails that cause pain while ambulating in shoes.  Patient is unable to trim their own nails.  Patient also has a very symptomatic painful callus to the plantar aspect of the right foot.  Patient is here for further evaluation and treatment.  Past Medical History:  Diagnosis Date   Cervical dysplasia    Complication of anesthesia    hard to wake up   Osteopenia 07/2012   T score -1.8 FRAX 3%/0.2%   Papilloma of right breast 2015   Wears glasses     OBJECTIVE General Patient is awake, alert, and oriented x 3 and in no acute distress. Derm Skin is dry and supple bilateral. Negative open lesions or macerations. Remaining integument unremarkable. Nails are tender, long, thickened and dystrophic with subungual debris, consistent with onychomycosis, 1-5 bilateral. No signs of infection noted.  There is a hyperkeratotic callus also noted to the plantar aspect of the fifth MTP right Vasc  DP and PT pedal pulses palpable bilaterally. Temperature gradient within normal limits.  Neuro Epicritic and protective threshold sensation grossly intact bilaterally.  Musculoskeletal Exam No symptomatic pedal deformities noted bilateral. Muscular strength within normal limits.  ASSESSMENT 1.  Pain due to onychomycosis of toenails both 2.  Symptomatic callus plantar aspect of the fifth MTP right  PLAN OF CARE 1. Patient evaluated today.  2. Instructed to maintain good pedal hygiene and foot care.  3. Mechanical debridement of nails 1-5 bilaterally performed using a nail nipper. Filed with dremel without incident.  4.  OTC topical Tolcylen antifungal dispensed at checkout to apply daily  5.   Excisional debridement of the hyperkeratotic callus was performed using a 312 scalpel without incident or bleeding  6.  Return to clinic in 3 mos.    Edrick Kins, DPM [facility]  Dr. Edrick Kins, DPM    [address], [place] [address]                Office [phone]  Fax [phone]

## 2022-04-05 IMAGING — MG MM DIGITAL SCREENING BILAT W/ TOMO AND CAD
8 series · 8 of 24 positions shown · non-contrast
Comparison: Previous exam(s).

CLINICAL DATA: Screening.

EXAM:
DIGITAL SCREENING BILATERAL MAMMOGRAM WITH TOMOSYNTHESIS AND CAD
TECHNIQUE: Bilateral screening digital craniocaudal and mediolateral oblique
mammograms were obtained. Bilateral screening digital breast
tomosynthesis was performed. The images were evaluated with
computer-aided detection.

[L CC synth-2D]
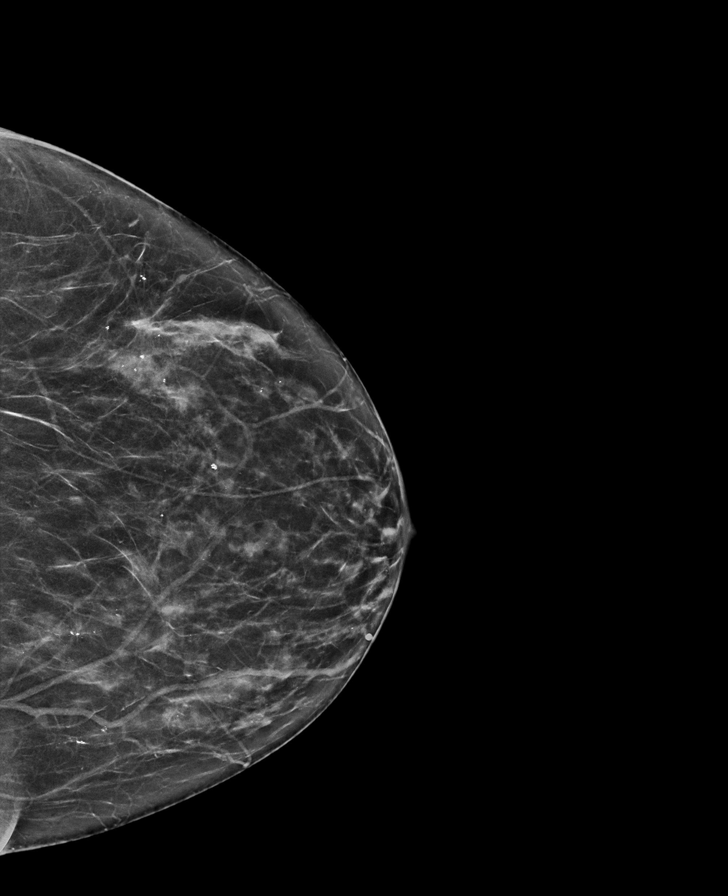

[R MLO synth-2D]
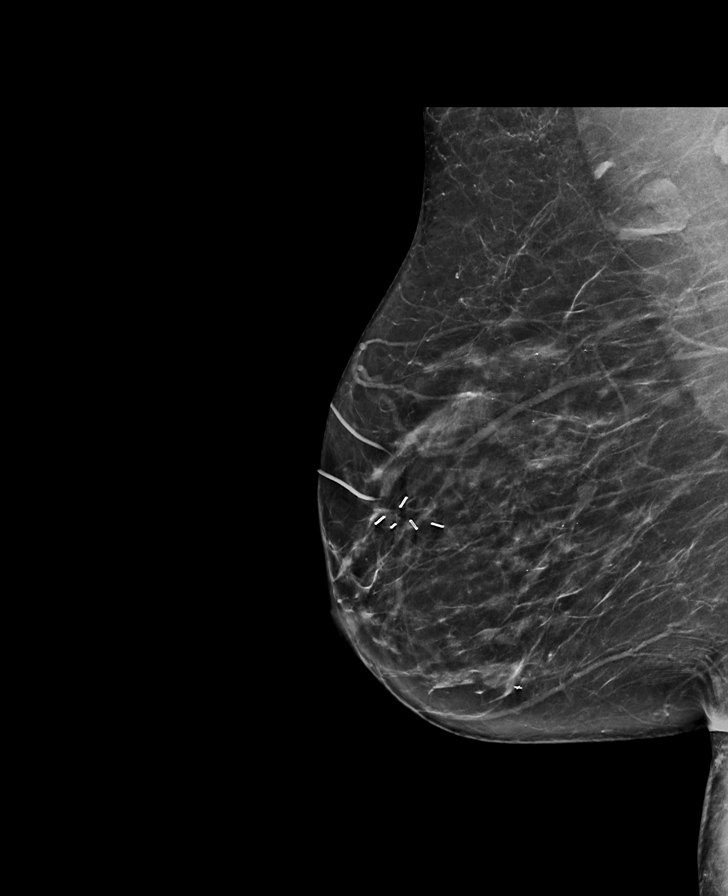

[R CC synth-2D]
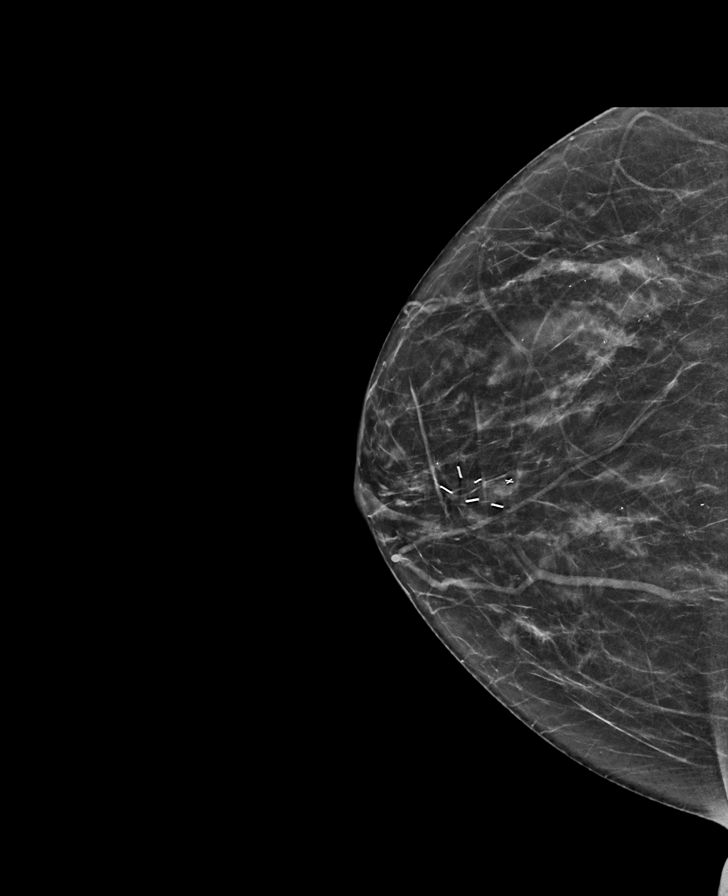

[L MLO synth-2D]
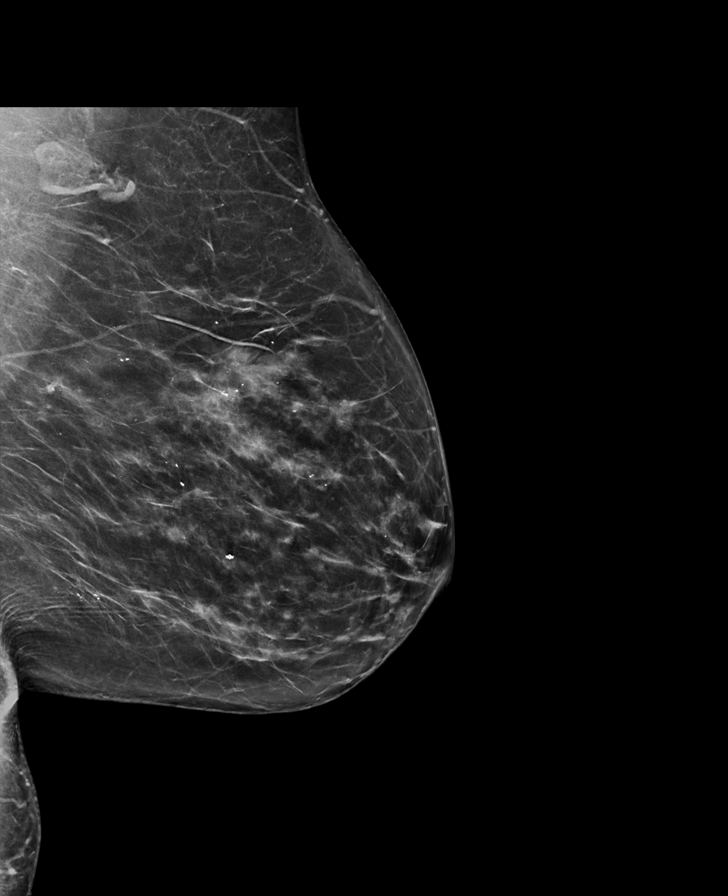

[R MLO tomo · tomo slice 35/69.0]
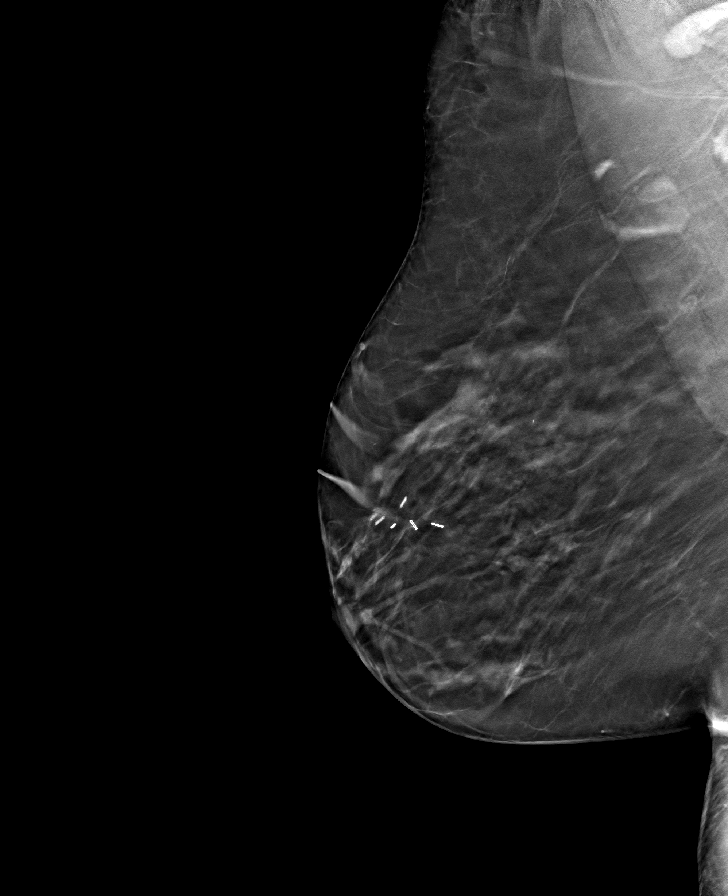

[L MLO tomo · tomo slice 37/73.0]
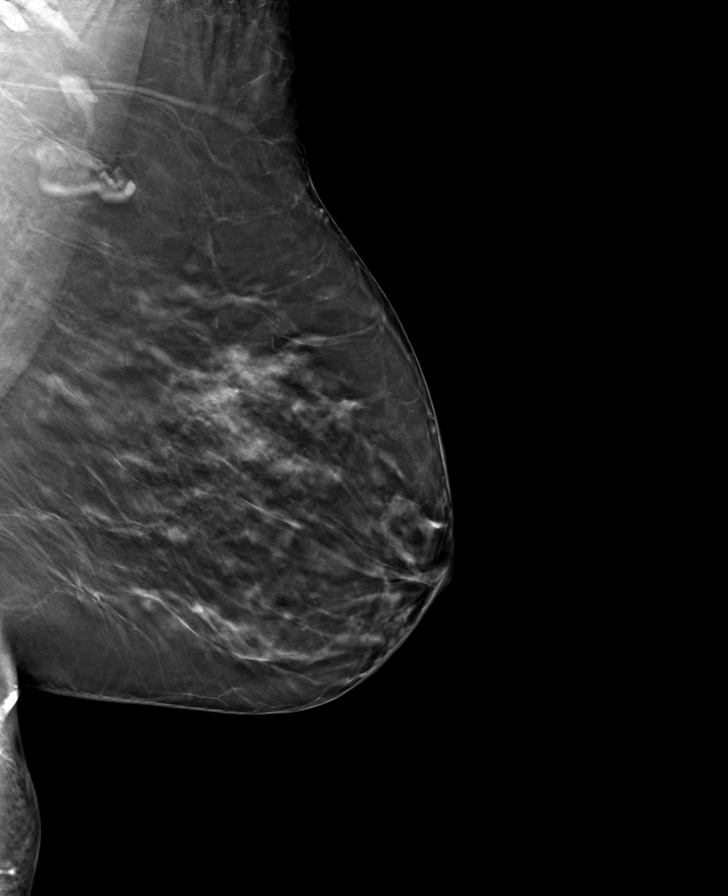

[L CC tomo · tomo slice 31/61.0]
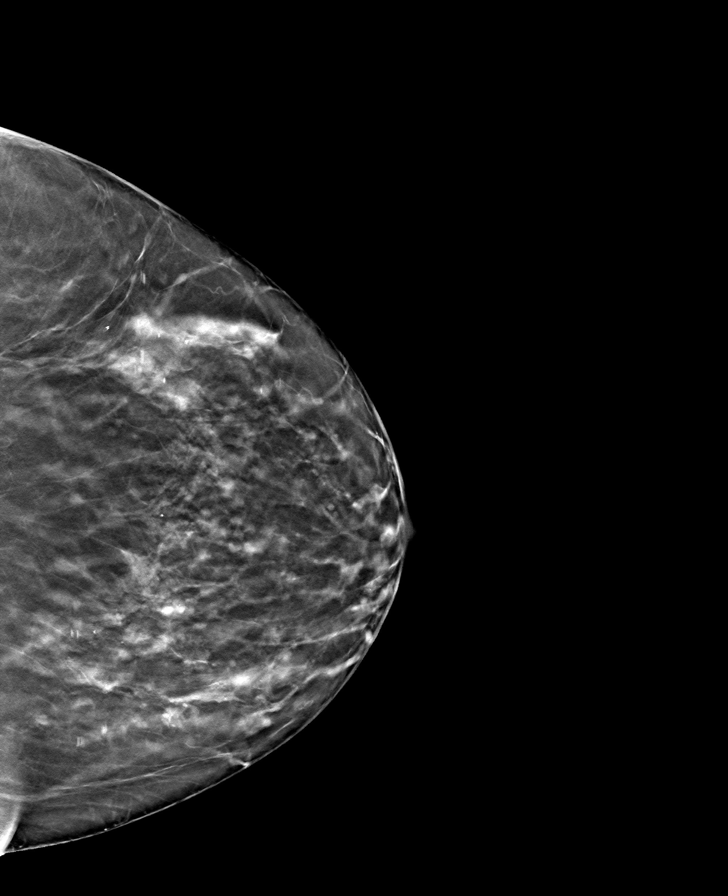

[R CC tomo · tomo slice 30/59.0]
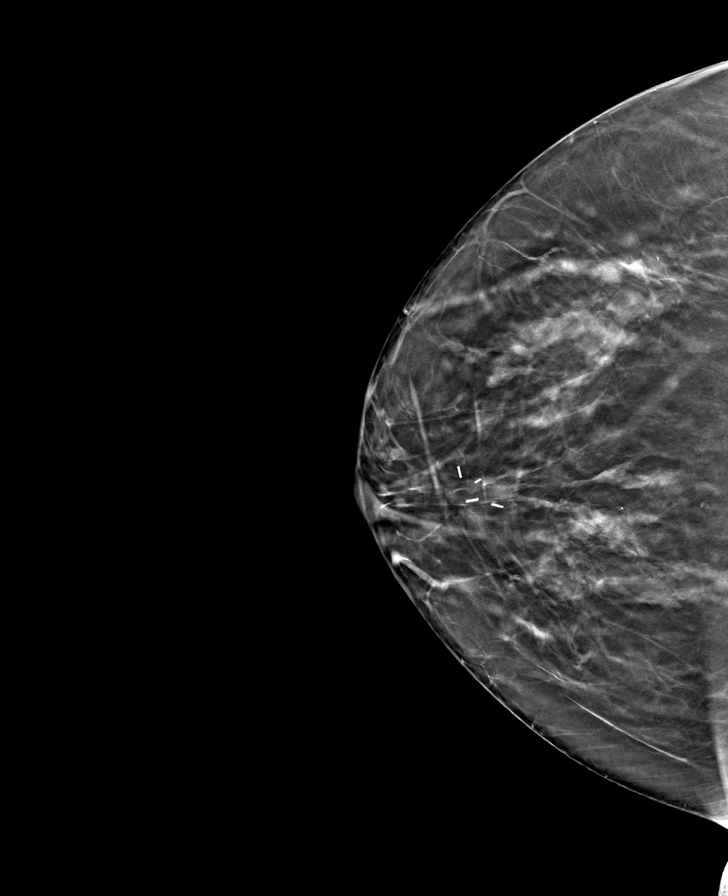

[8 of 24 positions shown; findings below may reference images not displayed]

ACR Breast Density Category b: There are scattered areas of
fibroglandular density.
FINDINGS: There are no findings suspicious for malignancy.
IMPRESSION: No mammographic evidence of malignancy. A result letter of this
screening mammogram will be mailed directly to the patient.

RECOMMENDATION:
Screening mammogram in one year. (Code:51-O-LD2)

BI-RADS CATEGORY  1: Negative.

## 2022-05-07 ENCOUNTER — Ambulatory Visit: Payer: Medicare PPO | Admitting: Podiatry

## 2022-05-09 ENCOUNTER — Ambulatory Visit: Payer: Medicare PPO | Admitting: Podiatry

## 2022-05-09 DIAGNOSIS — B351 Tinea unguium: Secondary | ICD-10-CM | POA: Diagnosis not present

## 2022-05-09 DIAGNOSIS — Z79899 Other long term (current) drug therapy: Secondary | ICD-10-CM

## 2022-05-09 DIAGNOSIS — M79675 Pain in left toe(s): Secondary | ICD-10-CM

## 2022-05-09 DIAGNOSIS — M79674 Pain in right toe(s): Secondary | ICD-10-CM

## 2022-05-09 NOTE — Progress Notes (Signed)
   Chief Complaint  Patient presents with   Nail Problem    Nail fungus follow-up     SUBJECTIVE Patient presents to office today complaining of elongated, thickened nails that cause pain while ambulating in shoes.  Patient is unable to trim their own nails.  Patient also has a very symptomatic painful callus to the plantar aspect of the right foot.  Patient has also been applying the topical Tolcylen antifungal daily with minimal improvement.  She is very concerned and self-conscious about her toenails.  Patient is here for further evaluation and treatment.  Past Medical History:  Diagnosis Date   Cervical dysplasia    Complication of anesthesia    hard to wake up   Osteopenia 07/2012   T score -1.8 FRAX 3%/0.2%   Papilloma of right breast 2015   Wears glasses     OBJECTIVE General Patient is awake, alert, and oriented x 3 and in no acute distress. Derm Skin is dry and supple bilateral. Negative open lesions or macerations. Remaining integument unremarkable. Nails are tender, long, thickened and dystrophic with subungual debris, consistent with onychomycosis, 1-5 bilateral. No signs of infection noted.  There is a hyperkeratotic callus also noted to the plantar aspect of the fifth MTP right Vasc  DP and PT pedal pulses palpable bilaterally. Temperature gradient within normal limits.  Neuro Epicritic and protective threshold sensation grossly intact bilaterally.  Musculoskeletal Exam No symptomatic pedal deformities noted bilateral. Muscular strength within normal limits.  ASSESSMENT 1.  Pain due to onychomycosis of toenails both 2.  Fungal nail infection bilateral  PLAN OF CARE 1. Patient evaluated today.  2. Instructed to maintain good pedal hygiene and foot care.  3. Mechanical debridement of nails 1-5 bilaterally performed using a nail nipper. Filed with dremel without incident.  4.  Continue OTC topical Tolcylen antifungal.  Additional dispensed at checkout 5.  Today we  discussed fungal nail infection in detail.  Discussed different treatment options including oral, topical which she is already using, and laser antifungal treatment modalities.  Efficacies and risks and benefits of each modality were explained in detail.  She would like to pursue the oral antifungal option. 6.  Prior to this order placed for hepatic function panel.  If WNL we will prescribe Lamisil 250 mg #90 daily 7.  Return to clinic 3 months   Edrick Kins, DPM [facility]  Dr. Edrick Kins, DPM    [address], [place] [address]                Office [phone]  Fax [phone]

## 2022-07-31 ENCOUNTER — Other Ambulatory Visit: Payer: Self-pay | Admitting: Internal Medicine

## 2022-07-31 DIAGNOSIS — Z1231 Encounter for screening mammogram for malignant neoplasm of breast: Secondary | ICD-10-CM

## 2022-08-13 ENCOUNTER — Ambulatory Visit: Payer: Medicare PPO | Admitting: Podiatry

## 2022-09-04 ENCOUNTER — Ambulatory Visit
Admission: RE | Admit: 2022-09-04 | Discharge: 2022-09-04 | Disposition: A | Discharge status: Home / Self Care | Payer: Medicare PPO | Source: Ambulatory Visit | Attending: Internal Medicine | Admitting: Internal Medicine

## 2022-09-04 DIAGNOSIS — Z1231 Encounter for screening mammogram for malignant neoplasm of breast: Secondary | ICD-10-CM | POA: Diagnosis not present

## 2022-10-15 ENCOUNTER — Ambulatory Visit: Payer: Medicare PPO | Admitting: Podiatry

## 2022-10-29 ENCOUNTER — Ambulatory Visit: Payer: Medicare PPO | Admitting: Podiatry

## 2022-10-29 ENCOUNTER — Encounter: Payer: Self-pay | Admitting: Podiatry

## 2022-10-29 DIAGNOSIS — Z79899 Other long term (current) drug therapy: Secondary | ICD-10-CM | POA: Diagnosis not present

## 2022-10-29 DIAGNOSIS — M79674 Pain in right toe(s): Secondary | ICD-10-CM

## 2022-10-29 DIAGNOSIS — M79675 Pain in left toe(s): Secondary | ICD-10-CM

## 2022-10-29 DIAGNOSIS — B351 Tinea unguium: Secondary | ICD-10-CM | POA: Diagnosis not present

## 2022-10-29 NOTE — Progress Notes (Unsigned)
   Chief Complaint  Patient presents with   Nail Problem    "That medicine is not helping.  Two of the toenails look like they may have cleared up a little."    SUBJECTIVE Patient presents to office today complaining of elongated, thickened nails that cause pain while ambulating in shoes.  Patient is unable to trim their own nails.  Patient also has a very symptomatic painful callus to the plantar aspect of the right foot.  Patient has also been applying the topical Tolcylen antifungal daily with minimal improvement.  She is very concerned and self-conscious about her toenails.  Patient is here for further evaluation and treatment.  Past Medical History:  Diagnosis Date   Cervical dysplasia    Complication of anesthesia    hard to wake up   Osteopenia 07/2012   T score -1.8 FRAX 3%/0.2%   Papilloma of right breast 2015   Wears glasses     OBJECTIVE General Patient is awake, alert, and oriented x 3 and in no acute distress. Derm Skin is dry and supple bilateral. Negative open lesions or macerations. Remaining integument unremarkable. Nails are tender, long, thickened and dystrophic with subungual debris, consistent with onychomycosis, 1-5 bilateral. No signs of infection noted.  There is a hyperkeratotic callus also noted to the plantar aspect of the fifth MTP right Vasc  DP and PT pedal pulses palpable bilaterally. Temperature gradient within normal limits.  Neuro Epicritic and protective threshold sensation grossly intact bilaterally.  Musculoskeletal Exam No symptomatic pedal deformities noted bilateral. Muscular strength within normal limits.  ASSESSMENT 1.  Pain due to onychomycosis of toenails both 2.  Fungal nail infection bilateral  PLAN OF CARE 1. Patient evaluated today.  2. Instructed to maintain good pedal hygiene and foot care.  3. Mechanical debridement of nails 1-5 bilaterally performed using a nail nipper. Filed with dremel without incident.  4.  Continue OTC topical  Tolcylen antifungal.  Additional dispensed at checkout 5.  Today we discussed fungal nail infection in detail.  Discussed different treatment options including oral, topical which she is already using, and laser antifungal treatment modalities.  Efficacies and risks and benefits of each modality were explained in detail.  She would like to pursue the oral antifungal option. 6.  Prior to this order placed for hepatic function panel.  If WNL we will prescribe Lamisil 250 mg #90 daily 7.  Return to clinic 3 months   Felecia Shelling, DPM [facility]  Dr. Felecia Shelling, DPM    [address] [place], [place] [address]                Office [phone]  Fax [phone]

## 2022-10-31 ENCOUNTER — Other Ambulatory Visit: Payer: Self-pay | Admitting: Podiatry

## 2022-10-31 DIAGNOSIS — Z79899 Other long term (current) drug therapy: Secondary | ICD-10-CM | POA: Diagnosis not present

## 2022-11-01 LAB — HEPATIC FUNCTION PANEL
ALT: 20 IU/L (ref 0–32)
AST: 29 IU/L (ref 0–40)
Albumin: 4.4 g/dL (ref 3.8–4.8)
Alkaline Phosphatase: 112 IU/L (ref 44–121)
Bilirubin Total: 0.3 mg/dL (ref 0.0–1.2)
Bilirubin, Direct: <0.1 mg/dL (ref 0.00–0.40)
Total Protein: 7.1 g/dL (ref 6.0–8.5)

## 2022-11-02 ENCOUNTER — Encounter: Payer: Self-pay | Admitting: Podiatry

## 2022-11-02 ENCOUNTER — Other Ambulatory Visit: Payer: Self-pay | Admitting: Podiatry

## 2022-11-02 MED ORDER — TERBINAFINE HCL 250 MG PO TABS: 250.0000 mg | ORAL_TABLET | Freq: Every day | ORAL | 0 refills | Status: DC

## 2023-01-29 DIAGNOSIS — R739 Hyperglycemia, unspecified: Secondary | ICD-10-CM | POA: Diagnosis not present

## 2023-01-29 DIAGNOSIS — M8580 Other specified disorders of bone density and structure, unspecified site: Secondary | ICD-10-CM | POA: Diagnosis not present

## 2023-01-29 DIAGNOSIS — E781 Pure hyperglyceridemia: Secondary | ICD-10-CM | POA: Diagnosis not present

## 2023-02-05 DIAGNOSIS — Z0000 Encounter for general adult medical examination without abnormal findings: Secondary | ICD-10-CM | POA: Diagnosis not present

## 2023-02-05 DIAGNOSIS — E669 Obesity, unspecified: Secondary | ICD-10-CM | POA: Diagnosis not present

## 2023-02-05 DIAGNOSIS — M25569 Pain in unspecified knee: Secondary | ICD-10-CM | POA: Diagnosis not present

## 2023-02-05 DIAGNOSIS — Z1339 Encounter for screening examination for other mental health and behavioral disorders: Secondary | ICD-10-CM | POA: Diagnosis not present

## 2023-02-05 DIAGNOSIS — B351 Tinea unguium: Secondary | ICD-10-CM | POA: Diagnosis not present

## 2023-02-05 DIAGNOSIS — Z1331 Encounter for screening for depression: Secondary | ICD-10-CM | POA: Diagnosis not present

## 2023-02-05 DIAGNOSIS — J302 Other seasonal allergic rhinitis: Secondary | ICD-10-CM | POA: Diagnosis not present

## 2023-02-05 DIAGNOSIS — Z23 Encounter for immunization: Secondary | ICD-10-CM | POA: Diagnosis not present

## 2023-03-13 ENCOUNTER — Ambulatory Visit: Payer: Medicare PPO | Admitting: Podiatry

## 2023-03-13 ENCOUNTER — Encounter: Payer: Self-pay | Admitting: Podiatry

## 2023-03-13 VITALS — Ht 59.0 in | Wt 205.0 lb

## 2023-03-13 DIAGNOSIS — M79675 Pain in left toe(s): Secondary | ICD-10-CM | POA: Diagnosis not present

## 2023-03-13 DIAGNOSIS — M79674 Pain in right toe(s): Secondary | ICD-10-CM

## 2023-03-13 DIAGNOSIS — B351 Tinea unguium: Secondary | ICD-10-CM

## 2023-03-13 NOTE — Progress Notes (Signed)
   Chief Complaint  Patient presents with   Nail Problem    Pt is here due to toe nail problem pt states she was filing her bilateral great toenails down and since then she has been having pain in both great toenails, she believe she may have an ingrown on both toenails.    SUBJECTIVE Patient presents to office today complaining of elongated, thickened nails that cause pain while ambulating in shoes.  Patient is unable to trim their own nails.  She is concerned for possible ingrown toenail as well.  She recently completed oral Lamisil and there has been some slight improvement of the nail.  Patient is here for further evaluation and treatment.  Past Medical History:  Diagnosis Date   Cervical dysplasia    Complication of anesthesia    hard to wake up   Osteopenia 07/2012   T score -1.8 FRAX 3%/0.2%   Papilloma of right breast 2015   Wears glasses     No Known Allergies   OBJECTIVE General Patient is awake, alert, and oriented x 3 and in no acute distress. Derm Skin is dry and supple bilateral. Negative open lesions or macerations. Remaining integument unremarkable. Nails are tender, long, thickened and dystrophic with subungual debris, consistent with onychomycosis, 1-5 bilateral. No signs of infection noted. Vasc  DP and PT pedal pulses palpable bilaterally. Temperature gradient within normal limits.  Neuro light touch and protective threshold sensation grossly intact bilaterally.  Musculoskeletal Exam No symptomatic pedal deformities noted bilateral. Muscular strength within normal limits.  ASSESSMENT 1.  Pain due to onychomycosis of toenails both  PLAN OF CARE -Patient evaluated today.  -Instructed to maintain good pedal hygiene and foot care.  -Mechanical debridement of nails 1-5 bilaterally performed using a nail nipper. Filed with dremel without incident.  -Patient recently completed 90 days of oral Lamisil.  She had updated blood work at her PCP and hepatic function WNL.   Date of collection: 02/04/2023.   -Refill prescription for Lamisil 2 and 50 mg #90 daily -Return to clinic in 3 mos.    Felecia Shelling, DPM [facility]  Dr. Felecia Shelling, DPM    [address] [place], [place] [address]                Office [phone]  Fax [phone]

## 2023-03-19 ENCOUNTER — Telehealth: Payer: Self-pay | Admitting: Podiatry

## 2023-03-19 NOTE — Telephone Encounter (Signed)
Patient called stating that a medication should've been sent in for her nail fungus. She stated it should be a refill for 3 months. Please send that in.

## 2023-03-20 ENCOUNTER — Other Ambulatory Visit: Payer: Self-pay

## 2023-03-20 MED ORDER — TERBINAFINE HCL 250 MG PO TABS: 250.0000 mg | ORAL_TABLET | Freq: Every day | ORAL | 0 refills | Status: AC

## 2023-06-12 ENCOUNTER — Encounter: Payer: Self-pay | Admitting: Podiatry

## 2023-06-12 ENCOUNTER — Ambulatory Visit: Payer: Medicare PPO | Admitting: Podiatry

## 2023-06-12 VITALS — Ht 59.0 in | Wt 205.0 lb

## 2023-06-12 DIAGNOSIS — B351 Tinea unguium: Secondary | ICD-10-CM | POA: Diagnosis not present

## 2023-06-12 DIAGNOSIS — M79675 Pain in left toe(s): Secondary | ICD-10-CM

## 2023-06-12 DIAGNOSIS — M79674 Pain in right toe(s): Secondary | ICD-10-CM | POA: Diagnosis not present

## 2023-06-12 NOTE — Progress Notes (Signed)
   Chief Complaint  Patient presents with   Nail Problem    Patient is here for [facility]    SUBJECTIVE Patient presents to office today complaining of elongated, thickened nails that cause pain while ambulating in shoes.  Patient is unable to trim their own nails. Patient is here for further evaluation and treatment.  Past Medical History:  Diagnosis Date   Cervical dysplasia    Complication of anesthesia    hard to wake up   Osteopenia 07/2012   T score -1.8 FRAX 3%/0.2%   Papilloma of right breast 2015   Wears glasses     No Known Allergies   OBJECTIVE General Patient is awake, alert, and oriented x 3 and in no acute distress. Derm Skin is dry and supple bilateral. Negative open lesions or macerations. Remaining integument unremarkable. Nails are tender, long, thickened and dystrophic with subungual debris, consistent with onychomycosis, 1-5 bilateral. No signs of infection noted. Vasc  DP and PT pedal pulses palpable bilaterally. Temperature gradient within normal limits.  Neuro Epicritic and protective threshold sensation grossly intact bilaterally.  Musculoskeletal Exam No symptomatic pedal deformities noted bilateral. Muscular strength within normal limits.  ASSESSMENT 1.  Pain due to onychomycosis of toenails both  PLAN OF CARE 1. Patient evaluated today.  Patient has about 6 pills left of the oral Lamisil .  She will complete this and we will hold off on refill for now 2. Instructed to maintain good pedal hygiene and foot care.  3. Mechanical debridement of nails 1-5 bilaterally performed using a nail nipper. Filed with dremel without incident.  4. Return to clinic in 3 mos.    Dot Gazella, DPM [facility]  Dr. Dot Gazella, DPM    [address] [place], [place] [address]                Office [phone]  Fax [phone]

## 2023-08-05 ENCOUNTER — Other Ambulatory Visit: Payer: Self-pay | Admitting: Internal Medicine

## 2023-08-05 DIAGNOSIS — Z1231 Encounter for screening mammogram for malignant neoplasm of breast: Secondary | ICD-10-CM

## 2023-09-18 ENCOUNTER — Ambulatory Visit: Admitting: Podiatry

## 2023-09-18 ENCOUNTER — Encounter: Payer: Self-pay | Admitting: Podiatry

## 2023-09-18 ENCOUNTER — Ambulatory Visit
Admission: RE | Admit: 2023-09-18 | Discharge: 2023-09-18 | Disposition: A | Discharge status: Home / Self Care | Source: Ambulatory Visit | Attending: Internal Medicine | Admitting: Internal Medicine

## 2023-09-18 VITALS — Ht 59.0 in | Wt 205.0 lb

## 2023-09-18 DIAGNOSIS — M79675 Pain in left toe(s): Secondary | ICD-10-CM | POA: Diagnosis not present

## 2023-09-18 DIAGNOSIS — M79674 Pain in right toe(s): Secondary | ICD-10-CM

## 2023-09-18 DIAGNOSIS — Z1231 Encounter for screening mammogram for malignant neoplasm of breast: Secondary | ICD-10-CM | POA: Diagnosis not present

## 2023-09-18 DIAGNOSIS — B351 Tinea unguium: Secondary | ICD-10-CM

## 2023-09-18 NOTE — Progress Notes (Signed)
   Chief Complaint  Patient presents with   Nail Problem    Pt is here for RFC.    SUBJECTIVE Patient presents to office today complaining of elongated, thickened nails that cause pain while ambulating in shoes.  Patient is unable to trim their own nails. Patient is here for further evaluation and treatment.  Past Medical History:  Diagnosis Date   Cervical dysplasia    Complication of anesthesia    hard to wake up   Osteopenia 07/2012   T score -1.8 FRAX 3%/0.2%   Papilloma of right breast 2015   Wears glasses     No Known Allergies   OBJECTIVE General Patient is awake, alert, and oriented x 3 and in no acute distress. Derm Skin is dry and supple bilateral. Negative open lesions or macerations. Remaining integument unremarkable. Nails are tender, long, thickened and dystrophic with subungual debris, consistent with onychomycosis, 1-5 bilateral. No signs of infection noted. Vasc  DP and PT pedal pulses palpable bilaterally. Temperature gradient within normal limits.  Neuro Epicritic and protective threshold sensation grossly intact bilaterally.  Musculoskeletal Exam No symptomatic pedal deformities noted bilateral. Muscular strength within normal limits.  ASSESSMENT 1.  Pain due to onychomycosis of toenails both  PLAN OF CARE 1. Patient evaluated today.  Patient has about 6 pills left of the oral Lamisil .  She will complete this and we will hold off on refill for now 2. Instructed to maintain good pedal hygiene and foot care.  3. Mechanical debridement of nails 1-5 bilaterally performed using a nail nipper. Filed with dremel without incident.  4. Return to clinic in 3 mos.    Thresa EMERSON Sar, DPM [facility]  Dr. Thresa EMERSON Sar, DPM    [address] [place], [place] [address]                Office [phone]  Fax [phone]

## 2023-12-18 ENCOUNTER — Ambulatory Visit: Admitting: Podiatry

## 2024-01-02 DIAGNOSIS — Z860101 Personal history of adenomatous and serrated colon polyps: Secondary | ICD-10-CM | POA: Diagnosis not present

## 2024-01-02 DIAGNOSIS — Z09 Encounter for follow-up examination after completed treatment for conditions other than malignant neoplasm: Secondary | ICD-10-CM | POA: Diagnosis not present

## 2024-01-02 DIAGNOSIS — K5730 Diverticulosis of large intestine without perforation or abscess without bleeding: Secondary | ICD-10-CM | POA: Diagnosis not present

## 2024-01-02 DIAGNOSIS — Z9889 Other specified postprocedural states: Secondary | ICD-10-CM | POA: Diagnosis not present

## 2024-01-22 ENCOUNTER — Ambulatory Visit: Admitting: Podiatry

## 2024-01-22 ENCOUNTER — Encounter: Payer: Self-pay | Admitting: Podiatry

## 2024-01-22 VITALS — Ht 59.0 in | Wt 205.0 lb

## 2024-01-22 DIAGNOSIS — M79675 Pain in left toe(s): Secondary | ICD-10-CM

## 2024-01-22 DIAGNOSIS — B351 Tinea unguium: Secondary | ICD-10-CM | POA: Diagnosis not present

## 2024-01-22 DIAGNOSIS — M79674 Pain in right toe(s): Secondary | ICD-10-CM | POA: Diagnosis not present

## 2024-01-22 NOTE — Progress Notes (Signed)
   Chief Complaint  Patient presents with   Nail Problem    Pt is here for RFC.    SUBJECTIVE Patient presents to office today complaining of elongated, thickened nails that cause pain while ambulating in shoes.  Patient is unable to trim their own nails. Patient is here for further evaluation and treatment.  Past Medical History:  Diagnosis Date   Cervical dysplasia    Complication of anesthesia    hard to wake up   Osteopenia 07/2012   T score -1.8 FRAX 3%/0.2%   Papilloma of right breast 2015   Wears glasses     No Known Allergies   OBJECTIVE General Patient is awake, alert, and oriented x 3 and in no acute distress. Derm Skin is dry and supple bilateral. Negative open lesions or macerations. Remaining integument unremarkable. Nails are tender, long, thickened and dystrophic with subungual debris, consistent with onychomycosis, 1-5 bilateral. No signs of infection noted. Vasc  DP and PT pedal pulses palpable bilaterally. Temperature gradient within normal limits.  Neuro Epicritic and protective threshold sensation grossly intact bilaterally.  Musculoskeletal Exam No symptomatic pedal deformities noted bilateral. Muscular strength within normal limits.  ASSESSMENT 1.  Pain due to onychomycosis of toenails both  PLAN OF CARE 1. Patient evaluated today.  Patient has about 6 pills left of the oral Lamisil .  She will complete this and we will hold off on refill for now 2. Instructed to maintain good pedal hygiene and foot care.  3. Mechanical debridement of nails 1-5 bilaterally performed using a nail nipper. Filed with dremel without incident.  4. Return to clinic in 3 mos.    Thresa EMERSON Sar, DPM [facility]  Dr. Thresa EMERSON Sar, DPM    [address] [place], [place] [address]                Office [phone]  Fax [phone]

## 2024-04-22 ENCOUNTER — Ambulatory Visit: Admitting: Podiatry
